# Patient Record
Sex: Male | Born: 1955 | Race: Black or African American | Hispanic: No | Marital: Married | Smoking: Current every day smoker
Health system: Southern US, Community
[De-identification: ages and names within clinical notes are randomized; demographics above are authoritative.]

---

## 2016-06-06 ENCOUNTER — Emergency Department (HOSPITAL_COMMUNITY)
Admission: EM | Admit: 2016-06-06 | Discharge: 2016-06-06 | Disposition: A | Discharge status: Home / Self Care | Payer: Self-pay | Attending: Emergency Medicine | Admitting: Emergency Medicine

## 2016-06-06 ENCOUNTER — Emergency Department (HOSPITAL_COMMUNITY): Payer: Self-pay

## 2016-06-06 ENCOUNTER — Encounter (HOSPITAL_COMMUNITY): Payer: Self-pay | Admitting: Emergency Medicine

## 2016-06-06 DIAGNOSIS — F17210 Nicotine dependence, cigarettes, uncomplicated: Secondary | ICD-10-CM | POA: Insufficient documentation

## 2016-06-06 DIAGNOSIS — R112 Nausea with vomiting, unspecified: Secondary | ICD-10-CM

## 2016-06-06 DIAGNOSIS — K2900 Acute gastritis without bleeding: Secondary | ICD-10-CM

## 2016-06-06 DIAGNOSIS — K2960 Other gastritis without bleeding: Secondary | ICD-10-CM | POA: Insufficient documentation

## 2016-06-06 LAB — COMPREHENSIVE METABOLIC PANEL
ALK PHOS: 46 U/L (ref 38–126)
ALT: 16 U/L — AB (ref 17–63)
AST: 24 U/L (ref 15–41)
Albumin: 4.4 g/dL (ref 3.5–5.0)
Anion gap: 10 (ref 5–15)
BUN: 13 mg/dL (ref 6–20)
CALCIUM: 9.6 mg/dL (ref 8.9–10.3)
CO2: 25 mmol/L (ref 22–32)
CREATININE: 0.88 mg/dL (ref 0.61–1.24)
Chloride: 102 mmol/L (ref 101–111)
GFR calc non Af Amer: >60 mL/min (ref >60)
Glucose, Bld: 139 mg/dL — ABNORMAL HIGH (ref 65–99)
Potassium: 4.4 mmol/L (ref 3.5–5.1)
SODIUM: 137 mmol/L (ref 135–145)
Total Bilirubin: 0.7 mg/dL (ref 0.3–1.2)
Total Protein: 8 g/dL (ref 6.5–8.1)

## 2016-06-06 LAB — CBC WITH DIFFERENTIAL/PLATELET
Basophils Absolute: 0 10*3/uL (ref 0.0–0.1)
Basophils Relative: 0 %
EOS ABS: 0 10*3/uL (ref 0.0–0.7)
Eosinophils Relative: 0 %
HCT: 45.6 % (ref 39.0–52.0)
HEMOGLOBIN: 15.6 g/dL (ref 13.0–17.0)
LYMPHS ABS: 1.3 10*3/uL (ref 0.7–4.0)
LYMPHS PCT: 16 %
MCH: 31.3 pg (ref 26.0–34.0)
MCHC: 34.2 g/dL (ref 30.0–36.0)
MCV: 91.4 fL (ref 78.0–100.0)
Monocytes Absolute: 0.6 10*3/uL (ref 0.1–1.0)
Monocytes Relative: 7 %
NEUTROS ABS: 6.5 10*3/uL (ref 1.7–7.7)
NEUTROS PCT: 77 %
Platelets: 221 10*3/uL (ref 150–400)
RBC: 4.99 MIL/uL (ref 4.22–5.81)
RDW: 14.1 % (ref 11.5–15.5)
WBC: 8.6 10*3/uL (ref 4.0–10.5)

## 2016-06-06 LAB — LIPASE, BLOOD: Lipase: 18 U/L (ref 11–51)

## 2016-06-06 MED ORDER — GI COCKTAIL ~~LOC~~
30.0000 mL | Freq: Once | ORAL | Status: AC
  Administered 2016-06-06: 30 mL via ORAL
  Filled 2016-06-06: qty 30

## 2016-06-06 MED ORDER — MORPHINE SULFATE (PF) 2 MG/ML IV SOLN
4.0000 mg | Freq: Once | INTRAVENOUS | Status: AC
  Administered 2016-06-06: 4 mg via INTRAVENOUS
  Filled 2016-06-06: qty 2

## 2016-06-06 MED ORDER — SODIUM CHLORIDE 0.9 % IV BOLUS (SEPSIS)
1000.0000 mL | Freq: Once | INTRAVENOUS | Status: AC
  Administered 2016-06-06: 1000 mL via INTRAVENOUS

## 2016-06-06 MED ORDER — FAMOTIDINE IN NACL 20-0.9 MG/50ML-% IV SOLN
20.0000 mg | Freq: Once | INTRAVENOUS | Status: AC
  Administered 2016-06-06: 20 mg via INTRAVENOUS
  Filled 2016-06-06: qty 50

## 2016-06-06 MED ORDER — ONDANSETRON HCL 4 MG PO TABS
4.0000 mg | ORAL_TABLET | Freq: Three times a day (TID) | ORAL | 0 refills | Status: DC | PRN
Start: 2016-06-06 — End: 2017-09-16

## 2016-06-06 MED ORDER — ONDANSETRON HCL 4 MG/2ML IJ SOLN
4.0000 mg | Freq: Once | INTRAMUSCULAR | Status: AC
  Administered 2016-06-06: 4 mg via INTRAVENOUS
  Filled 2016-06-06: qty 2

## 2016-06-06 NOTE — ED Provider Notes (Signed)
[facility]-EM[facility]Y DEPT Provider Note   CSN: [id number] Arrival date & time: 06/06/16  1428     History   Chief Complaint Chief Complaint  Patient presents with  . Emesis    HPI Glenn Levine is a 60 y.o. male.  HPI 60 year old male who presents with abdominal pain, nausea and vomiting. He is otherwise healthy and denies any past abdominal surgeries. States being in his usual state of health and around 10 AM this morning after drinking a cup of coffee developed left upper quadrant abdominal pain. States shortly afterwards he has had multiple episodes of intractable nonbloody nonbilious emesis. Denies any diarrhea or constipation. Pain localized to the left upper quadrant, stabbing in nature, constant, and unremitting. Has not tried any medications prior to arrival. Recently has also had associating productive cough, congestion, and runny nose. No fevers but endorses chills. No dysuria or urinary frequency. No known sick contacts and no recent travel. No chest pain or difficulty breathing. History reviewed. No pertinent past medical history.  There are no active problems to display for this patient.   History reviewed. No pertinent surgical history.     Home Medications    Prior to Admission medications   Medication Sig Start Date End Date Taking? Authorizing Provider  ondansetron (ZOFRAN) 4 MG tablet Take 1 tablet (4 mg total) by mouth every 8 (eight) hours as needed for nausea or vomiting. 06/06/16   Lavera Guiseana Duo Janett Kamath, MD    Family History No family history on file. Reviewed, noncontributory Social History Social History  Substance Use Topics  . Smoking status: Current Every Day Smoker    Packs/day: 1.00    Types: Cigarettes  . Smokeless tobacco: Never Used  . Alcohol use No     Allergies   Review of patient's allergies indicates not on file.   Review of Systems Review of Systems 10/14 systems reviewed and are negative other than those stated in the  HPI   Physical Exam Updated Vital Signs BP 135/83 (BP Location: Left Arm)   Pulse 68   Temp 97.6 F (36.4 C) (Oral)   Resp 18   Ht 5\' 9"  (1.753 m)   Wt 170 lb (77.1 kg)   SpO2 99%   BMI 25.10 kg/m   Physical Exam Physical Exam  Nursing note and vitals reviewed. Constitutional: appears uncomfortable with active chills, non-toxic, and in no acute distress Head: Normocephalic and atraumatic.  Mouth/Throat: Oropharynx is clear and dry.  Neck: Normal range of motion. Neck supple.  Cardiovascular: Normal rate and regular rhythm.   Pulmonary/Chest: Effort normal and breath sounds normal.  Abdominal: Soft. There is LUQ tenderness. There is no rebound and no guarding.  Musculoskeletal: Normal range of motion.  Neurological: Alert, no facial droop, fluent speech, moves all extremities symmetrically Skin: Skin is warm and dry.  Psychiatric: Cooperative   ED Treatments / Results  Labs (all labs ordered are listed, but only abnormal results are displayed) Labs Reviewed  COMPREHENSIVE METABOLIC PANEL - Abnormal; Notable for the following:       Result Value   Glucose, Bld 139 (*)    ALT 16 (*)    All other components within normal limits  CBC WITH DIFFERENTIAL/PLATELET  LIPASE, BLOOD    EKG  EKG Interpretation None       Radiology Dg Chest 2 View  Result Date: 06/06/2016 CLINICAL DATA:  Productive cough and chills EXAM: CHEST  2 VIEW COMPARISON:  None. FINDINGS: Normal heart size. Normal mediastinal contour. No pneumothorax. No  pleural effusion. Hyperinflated lungs. No pulmonary edema. No acute consolidative airspace disease. IMPRESSION: 1. Hyperinflated lungs, suggesting obstructive lung disease. 2. No acute consolidative airspace disease to suggest a pneumonia. Electronically Signed   By: Delbert PhenixJason A Poff M.D.   On: 06/06/2016 15:32    Procedures Procedures (including critical care time)  Medications Ordered in ED Medications  sodium chloride 0.9 % bolus 1,000 mL (0 mLs  Intravenous Stopped 06/06/16 1627)  ondansetron (ZOFRAN) injection 4 mg (4 mg Intravenous Given 06/06/16 1515)  morphine 2 MG/ML injection 4 mg (4 mg Intravenous Given 06/06/16 1515)  famotidine (PEPCID) IVPB 20 mg premix (0 mg Intravenous Stopped 06/06/16 1701)  gi cocktail (Maalox,Lidocaine,Donnatal) (30 mLs Oral Given 06/06/16 1703)     Initial Impression / Assessment and Plan / ED Course  I have reviewed the triage vital signs and the nursing notes.  Pertinent labs & imaging results that were available during my care of the patient were reviewed by me and considered in my medical decision making (see chart for details).  Clinical Course    60 year old male, otherwise healthy, who presents with left upper quadrant abdominal pain, nausea and vomiting. He is nontoxic in no acute distress with normal vital signs. Please mildly dry on exam. Overall soft and nonsurgical abdomen with left upper quadrant tenderness to palpation. Clinically, no signs or risk factors for obstructions. Remainder of exam is concerning. Basic blood work reveals a reassuring CBC, comp metabolic panel, lipase. Chest x-ray shows no acute cardiopulmonary processes or free air. Given IVF and antiemetics. With improved symptoms. Able to tolerate PO. Will continue supportive care management. Strict return and follow-up instructions reviewed. He expressed understanding of all discharge instructions and felt comfortable with the plan of care.   Final Clinical Impressions(s) / ED Diagnoses   Final diagnoses:  Non-intractable vomiting with nausea, unspecified vomiting type  Other acute gastritis without hemorrhage    New Prescriptions New Prescriptions   ONDANSETRON (ZOFRAN) 4 MG TABLET    Take 1 tablet (4 mg total) by mouth every 8 (eight) hours as needed for nausea or vomiting.     Lavera Guiseana Duo Jearld Hemp, MD 06/06/16 [phone]

## 2016-06-06 NOTE — Discharge Instructions (Signed)
Take medications as prescribed. Avoid anti-inflammatory medications, alcohol, fatty foods. Return for worsening symptoms, including fever, worsening pain, intractable vomiting or any other symptoms concerning to you.

## 2016-06-06 NOTE — ED Notes (Signed)
Patient transported to X-ray 

## 2016-06-06 NOTE — ED Triage Notes (Signed)
Pt woke up fine, later had abdominal pain and started vomiting, states vomited 10 times plus, green at times

## 2017-07-28 ENCOUNTER — Encounter (HOSPITAL_COMMUNITY): Payer: Self-pay | Admitting: Emergency Medicine

## 2017-07-28 ENCOUNTER — Emergency Department (HOSPITAL_COMMUNITY)
Admission: EM | Admit: 2017-07-28 | Discharge: 2017-07-28 | Disposition: A | Discharge status: Home / Self Care | Payer: Self-pay | Attending: Emergency Medicine | Admitting: Emergency Medicine

## 2017-07-28 ENCOUNTER — Emergency Department (HOSPITAL_COMMUNITY): Payer: Self-pay

## 2017-07-28 DIAGNOSIS — R109 Unspecified abdominal pain: Secondary | ICD-10-CM

## 2017-07-28 DIAGNOSIS — F17210 Nicotine dependence, cigarettes, uncomplicated: Secondary | ICD-10-CM | POA: Insufficient documentation

## 2017-07-28 DIAGNOSIS — R1012 Left upper quadrant pain: Secondary | ICD-10-CM | POA: Insufficient documentation

## 2017-07-28 DIAGNOSIS — R112 Nausea with vomiting, unspecified: Secondary | ICD-10-CM | POA: Insufficient documentation

## 2017-07-28 DIAGNOSIS — R1032 Left lower quadrant pain: Secondary | ICD-10-CM | POA: Insufficient documentation

## 2017-07-28 LAB — URINALYSIS, ROUTINE W REFLEX MICROSCOPIC
BILIRUBIN URINE: NEGATIVE
Bacteria, UA: NONE SEEN
Glucose, UA: 50 mg/dL — AB
Hgb urine dipstick: NEGATIVE
KETONES UR: 5 mg/dL — AB
LEUKOCYTES UA: NEGATIVE
Nitrite: NEGATIVE
PH: 7 (ref 5.0–8.0)
PROTEIN: 30 mg/dL — AB
Specific Gravity, Urine: 1.024 (ref 1.005–1.030)

## 2017-07-28 LAB — COMPREHENSIVE METABOLIC PANEL
ALT: 30 U/L (ref 17–63)
ANION GAP: 14 (ref 5–15)
AST: 38 U/L (ref 15–41)
Albumin: 4.4 g/dL (ref 3.5–5.0)
Alkaline Phosphatase: 58 U/L (ref 38–126)
BUN: 15 mg/dL (ref 6–20)
CHLORIDE: 105 mmol/L (ref 101–111)
CO2: 22 mmol/L (ref 22–32)
Calcium: 9.6 mg/dL (ref 8.9–10.3)
Creatinine, Ser: 0.89 mg/dL (ref 0.61–1.24)
GFR calc non Af Amer: >60 mL/min (ref >60)
Glucose, Bld: 159 mg/dL — ABNORMAL HIGH (ref 65–99)
POTASSIUM: 3.8 mmol/L (ref 3.5–5.1)
SODIUM: 141 mmol/L (ref 135–145)
Total Bilirubin: 0.5 mg/dL (ref 0.3–1.2)
Total Protein: 8 g/dL (ref 6.5–8.1)

## 2017-07-28 LAB — LIPASE, BLOOD: LIPASE: 25 U/L (ref 11–51)

## 2017-07-28 LAB — CBC
HCT: 45.1 % (ref 39.0–52.0)
HEMOGLOBIN: 14.9 g/dL (ref 13.0–17.0)
MCH: 30.1 pg (ref 26.0–34.0)
MCHC: 33 g/dL (ref 30.0–36.0)
MCV: 91.1 fL (ref 78.0–100.0)
Platelets: 217 10*3/uL (ref 150–400)
RBC: 4.95 MIL/uL (ref 4.22–5.81)
RDW: 13.7 % (ref 11.5–15.5)
WBC: 8.4 10*3/uL (ref 4.0–10.5)

## 2017-07-28 MED ORDER — DICYCLOMINE HCL 20 MG PO TABS: 20.0000 mg | ORAL_TABLET | Freq: Four times a day (QID) | ORAL | 0 refills | Status: DC | PRN

## 2017-07-28 MED ORDER — IOPAMIDOL (ISOVUE-300) INJECTION 61%
100.0000 mL | Freq: Once | INTRAVENOUS | Status: AC | PRN
  Administered 2017-07-28: 100 mL via INTRAVENOUS

## 2017-07-28 MED ORDER — FAMOTIDINE IN NACL 20-0.9 MG/50ML-% IV SOLN
20.0000 mg | Freq: Once | INTRAVENOUS | Status: AC
  Administered 2017-07-28: 20 mg via INTRAVENOUS
  Filled 2017-07-28: qty 50

## 2017-07-28 MED ORDER — SODIUM CHLORIDE 0.9 % IV BOLUS (SEPSIS)
500.0000 mL | Freq: Once | INTRAVENOUS | Status: AC
  Administered 2017-07-28: 500 mL via INTRAVENOUS

## 2017-07-28 MED ORDER — SODIUM CHLORIDE 0.9 % IV SOLN
INTRAVENOUS | Status: DC
  Administered 2017-07-28: 11:00:00 via INTRAVENOUS

## 2017-07-28 MED ORDER — ONDANSETRON HCL 4 MG/2ML IJ SOLN
4.0000 mg | Freq: Once | INTRAMUSCULAR | Status: AC | PRN
  Administered 2017-07-28: 4 mg via INTRAVENOUS
  Filled 2017-07-28: qty 2

## 2017-07-28 MED ORDER — DICYCLOMINE HCL 10 MG/ML IM SOLN
20.0000 mg | Freq: Once | INTRAMUSCULAR | Status: AC
  Administered 2017-07-28: 20 mg via INTRAMUSCULAR
  Filled 2017-07-28: qty 2

## 2017-07-28 MED ORDER — MORPHINE SULFATE (PF) 4 MG/ML IV SOLN
4.0000 mg | INTRAVENOUS | Status: AC | PRN
  Administered 2017-07-28 (×2): 4 mg via INTRAVENOUS
  Filled 2017-07-28 (×2): qty 1

## 2017-07-28 MED ORDER — ONDANSETRON HCL 4 MG/2ML IJ SOLN
4.0000 mg | INTRAMUSCULAR | Status: DC | PRN
Start: 2017-07-28 — End: 2017-07-28
  Administered 2017-07-28: 4 mg via INTRAVENOUS
  Filled 2017-07-28: qty 2

## 2017-07-28 MED ORDER — ONDANSETRON 4 MG PO TBDP: 4.0000 mg | ORAL_TABLET | Freq: Three times a day (TID) | ORAL | 0 refills | Status: DC | PRN

## 2017-07-28 NOTE — ED Triage Notes (Signed)
Pt reports sudden onset of generalized abdominal pain with n/v that began this morning. Denies diarrhea. States he has vomited > 30 times.

## 2017-07-28 NOTE — ED Notes (Signed)
EDP at bedside updating patient and family. 

## 2017-07-28 NOTE — ED Notes (Signed)
Pt tolerating PO fluids at this time.

## 2017-07-28 NOTE — ED Notes (Signed)
Pt given ginger ale.

## 2017-07-28 NOTE — ED Provider Notes (Signed)
[facility] Provider [facility]   CSN: [id number] Arrival date & time: 07/28/17  1004     History   Chief Complaint Chief Complaint  Patient presents with  . Abdominal Pain    HPI Glenn Levine is a 61 y.o. male.  HPI  Pt was seen at 1055.  Per pt, c/o gradual onset and persistence of constant left sided abd "pain" since approximately 0400/0500 PTA. Has been associated with multiple intermittent episodes of N/V.  Describes the abd pain as "cramping."  Denies diarrhea, no fevers, no back pain, no rash, no CP/SOB, no black or blood in stools or emesis.       History reviewed. No pertinent past medical history.  There are no active problems to display for this patient.   History reviewed. No pertinent surgical history.     Home Medications    Prior to Admission medications   Medication Sig Start Date End Date Taking? Authorizing Provider  ondansetron (ZOFRAN) 4 MG tablet Take 1 tablet (4 mg total) by mouth every 8 (eight) hours as needed for nausea or vomiting. 06/06/16   Lavera GuiseLiu, Dana Duo, MD    Family History No family history on file.  Social History Social History   Tobacco Use  . Smoking status: Current Every Day Smoker    Packs/day: 1.00    Types: Cigarettes  . Smokeless tobacco: Never Used  Substance Use Topics  . Alcohol use: Yes    Comment: socially  . Drug use: No     Allergies   Erythromycin   Review of Systems Review of Systems ROS: Statement: All systems negative except as marked or noted in the HPI; Constitutional: Negative for fever and chills. ; ; Eyes: Negative for eye pain, redness and discharge. ; ; ENMT: Negative for ear pain, hoarseness, nasal congestion, sinus pressure and sore throat. ; ; Cardiovascular: Negative for chest pain, palpitations, diaphoresis, dyspnea and peripheral edema. ; ; Respiratory: Negative for cough, wheezing and stridor. ; ; Gastrointestinal: +N/V, abd pain. Negative for diarrhea, blood in stool,  hematemesis, jaundice and rectal bleeding. . ; ; Genitourinary: Negative for dysuria, flank pain and hematuria. ; ; Genital:  No penile drainage or rash, no testicular pain or swelling, no scrotal rash or swelling. ;; Musculoskeletal: Negative for back pain and neck pain. Negative for swelling and trauma.; ; Skin: Negative for pruritus, rash, abrasions, blisters, bruising and skin lesion.; ; Neuro: Negative for headache, lightheadedness and neck stiffness. Negative for weakness, altered level of consciousness, altered mental status, extremity weakness, paresthesias, involuntary movement, seizure and syncope.       Physical Exam Updated Vital Signs BP (!) 168/88 (BP Location: Right Arm)   Pulse 67   Temp 98.2 F (36.8 C) (Oral)   Resp 18   Ht 5\' 9"  (1.753 m)   Wt 77.1 kg (170 lb)   SpO2 100%   BMI 25.10 kg/m   Physical Exam 1100: Physical examination:  Nursing notes reviewed; Vital signs and O2 SAT reviewed;  Constitutional: Well developed, Well nourished, Well hydrated, Uncomfortable appearing.; Head:  Normocephalic, atraumatic; Eyes: EOMI, PERRL, No scleral icterus; ENMT: Mouth and pharynx normal, Mucous membranes moist; Neck: Supple, Full range of motion, No lymphadenopathy; Cardiovascular: Regular rate and rhythm, No gallop; Respiratory: Breath sounds clear & equal bilaterally, No wheezes.  Speaking full sentences with ease, Normal respiratory effort/excursion; Chest: Nontender, Movement normal; Abdomen: Soft, +LUQ and LLQ tenderness to palp. No rebound or guarding. Nondistended, Normal bowel sounds; Genitourinary: No CVA tenderness;  Extremities: Pulses normal, No tenderness, No edema, No calf edema or asymmetry.; Neuro: AA&Ox3, Major CN grossly intact.  Speech clear. No gross focal motor or sensory deficits in extremities.; Skin: Color normal, Warm, Dry.   ED Treatments / Results  Labs (all labs ordered are listed, but only abnormal results are displayed)   EKG  EKG  Interpretation None       Radiology   Procedures Procedures (including critical care time)  Medications Ordered in ED Medications  morphine 4 MG/ML injection 4 mg (not administered)  ondansetron (ZOFRAN) injection 4 mg (not administered)  0.9 %  sodium chloride infusion (not administered)  sodium chloride 0.9 % bolus 500 mL (not administered)  ondansetron (ZOFRAN) injection 4 mg (4 mg Intravenous Given 07/28/17 1037)     Initial Impression / Assessment and Plan / ED Course  I have reviewed the triage vital signs and the nursing notes.  Pertinent labs & imaging results that were available during my care of the patient were reviewed by me and considered in my medical decision making (see chart for details).  MDM Reviewed: previous chart, nursing note and vitals Reviewed previous: labs Interpretation: labs, x-ray and CT scan    Results for orders placed or performed during the hospital encounter of 07/28/17  Lipase, blood  Result Value Ref Range   Lipase 25 11 - 51 U/L  Comprehensive metabolic panel  Result Value Ref Range   Sodium 141 135 - 145 mmol/L   Potassium 3.8 3.5 - 5.1 mmol/L   Chloride 105 101 - 111 mmol/L   CO2 22 22 - 32 mmol/L   Glucose, Bld 159 (H) 65 - 99 mg/dL   BUN 15 6 - 20 mg/dL   Creatinine, Ser 1.61 0.61 - 1.24 mg/dL   Calcium 9.6 8.9 - 09.6 mg/dL   Total Protein 8.0 6.5 - 8.1 g/dL   Albumin 4.4 3.5 - 5.0 g/dL   AST 38 15 - 41 U/L   ALT 30 17 - 63 U/L   Alkaline Phosphatase 58 38 - 126 U/L   Total Bilirubin 0.5 0.3 - 1.2 mg/dL   GFR calc non Af Amer >60 >60 mL/min   GFR calc Af Amer >60 >60 mL/min   Anion gap 14 5 - 15  CBC  Result Value Ref Range   WBC 8.4 4.0 - 10.5 K/uL   RBC 4.95 4.22 - 5.81 MIL/uL   Hemoglobin 14.9 13.0 - 17.0 g/dL   HCT 04.5 40.9 - 81.1 %   MCV 91.1 78.0 - 100.0 fL   MCH 30.1 26.0 - 34.0 pg   MCHC 33.0 30.0 - 36.0 g/dL   RDW 91.4 78.2 - 95.6 %   Platelets 217 150 - 400 K/uL  Urinalysis, Routine w reflex  microscopic  Result Value Ref Range   Color, Urine YELLOW YELLOW   APPearance CLEAR CLEAR   Specific Gravity, Urine 1.024 1.005 - 1.030   pH 7.0 5.0 - 8.0   Glucose, UA 50 (A) NEGATIVE mg/dL   Hgb urine dipstick NEGATIVE NEGATIVE   Bilirubin Urine NEGATIVE NEGATIVE   Ketones, ur 5 (A) NEGATIVE mg/dL   Protein, ur 30 (A) NEGATIVE mg/dL   Nitrite NEGATIVE NEGATIVE   Leukocytes, UA NEGATIVE NEGATIVE   RBC / HPF 0-5 0 - 5 RBC/hpf   WBC, UA 0-5 0 - 5 WBC/hpf   Bacteria, UA NONE SEEN NONE SEEN   Squamous Epithelial / LPF 0-5 (A) NONE SEEN   Mucus PRESENT    Dg  Chest 2 View  Result Date: 07/28/2017 CLINICAL DATA:  Abdominal pain, nausea, vomiting EXAM: CHEST  2 VIEW COMPARISON:  06/06/2016 FINDINGS: Heart and mediastinal contours are within normal limits. No focal opacities or effusions. No acute bony abnormality. Mild hyperinflation. IMPRESSION: Stable mild hyperinflation.  No active cardiopulmonary disease. Electronically Signed   By: Charlett NoseKevin  Dover M.D.   On: 07/28/2017 11:53   Ct Abdomen Pelvis W Contrast Result Date: 07/28/2017 CLINICAL DATA:  61 year old with generalized abdominal pain with nausea and vomiting. EXAM: CT ABDOMEN AND PELVIS WITH CONTRAST TECHNIQUE: Multidetector CT imaging of the abdomen and pelvis was performed using the standard protocol following bolus administration of intravenous contrast. CONTRAST:  100 mL Isovue-300 COMPARISON:  CT 06/02/2011 FINDINGS: Lower chest: There may be mild emphysematous changes in the lungs. Lung bases are clear without pleural effusions. Hepatobiliary: Normal appearance of the liver, gallbladder and portal venous system. Pancreas: Normal appearance of the pancreas without inflammation or duct dilatation. Spleen: Normal appearance of spleen without enlargement. Adrenals/Urinary Tract: Normal adrenal glands. Normal kidneys without hydronephrosis. No suspicious renal lesions. Urinary bladder is unremarkable. Stomach/Bowel: Extensive diverticulosis  involving the sigmoid colon but no acute bowel inflammation. Normal caliber of the appendix but the mid and distal aspect to the appendix contains very low density material. Hounsfield units in the appendix measure roughly -50 and suggestive for fat material. There is no evidence for acute appendix inflammation. Small hiatal hernia. There is no significant stomach distention. Normal appearance of the duodenum. Mild stranding at the root of the mesentery with few prominent lymph nodes and these findings are similar to the prior examination. Vascular/Lymphatic: Main visceral arteries are patent. Atherosclerotic disease in the abdominal aorta. Infrarenal abdominal aorta is mildly enlarged measuring up to 2.9 cm. Atherosclerotic calcifications in the bilateral iliac arteries. Proximal femoral arteries are patent bilaterally. Reproductive: Few prominent central mesenteric lymph nodes. Overall, no significant lymph node enlargement in the abdomen or pelvis. Other: No free fluid.  No free air. Musculoskeletal: No acute bone abnormality. Mild disc space narrowing at L4-L5 and L5-S1. IMPRESSION: No acute abnormality in the abdomen or pelvis. Mid and distal appendix contains very low-density material. Findings raise the possibility of a lipoma or lipomatous changes in the appendix. No acute inflammatory changes in the appendix. Small hiatal hernia. Diverticulosis without acute inflammation. Abdominal aorta measures 2.9 cm. Ectatic abdominal aorta at risk for aneurysm development. Recommend followup by ultrasound in 5 years. This recommendation follows ACR consensus guidelines: White Paper of the ACR Incidental Findings Committee II on Vascular Findings. J Am Coll Radiol 2013; 10:789-794. Electronically Signed   By: Richarda OverlieAdam  Henn M.D.   On: 07/28/2017 12:36    1425:  Pt has tol PO well while in the ED without N/V.  No stooling while in the ED.  Abd benign, VSS. Feels better and wants to go home now. Tx symptomatically at this  time. Dx and testing d/w pt and family.  Questions answered.  Verb understanding, agreeable to d/c home with outpt f/u.      Final Clinical Impressions(s) / ED Diagnoses   Final diagnoses:  None    ED Discharge Orders    None       Samuel JesterMcManus, Hyland Mollenkopf, DO 07/29/17 1924

## 2017-07-28 NOTE — ED Notes (Signed)
Pt notified a urine sample is needed. Verbalized understanding and will let staff know when one can be obtained. Urinal at bedside.

## 2017-07-28 NOTE — Discharge Instructions (Signed)
Take the prescriptions as directed.  Increase your fluid intake (ie:  Gatoraide) for the next few days, as discussed.  Eat a bland diet and advance to your regular diet slowly as you can tolerate it. Your CT scan showed an incidental finding(s): "Abdominal aorta measures 2.9 cm. Ectatic abdominal aorta at risk for aneurysm development. Recommend followup by ultrasound in 5 years. This recommendation follows ACR consensus guidelines: White Paper of the ACR Incidental Findings Committee II on Vascular Findings. J Am Coll Radiol 2013; 10:789-794."  Your regular medical doctor can follow up this finding. Call your regular medical doctor tomorrow to schedule a follow up appointment this week.  Return to the Emergency Department immediately if not improving (or even worsening) despite taking the medicines as prescribed, any black or bloody stool or vomit, if you develop a fever over "101," or for any other concerns.

## 2017-09-16 ENCOUNTER — Encounter (HOSPITAL_COMMUNITY): Payer: Self-pay

## 2017-09-16 ENCOUNTER — Emergency Department (HOSPITAL_COMMUNITY)
Admission: EM | Admit: 2017-09-16 | Discharge: 2017-09-16 | Disposition: A | Discharge status: Home / Self Care | Payer: Self-pay | Attending: Emergency Medicine | Admitting: Emergency Medicine

## 2017-09-16 ENCOUNTER — Emergency Department (HOSPITAL_COMMUNITY): Payer: Self-pay

## 2017-09-16 ENCOUNTER — Other Ambulatory Visit: Payer: Self-pay

## 2017-09-16 DIAGNOSIS — J111 Influenza due to unidentified influenza virus with other respiratory manifestations: Secondary | ICD-10-CM | POA: Insufficient documentation

## 2017-09-16 DIAGNOSIS — R69 Illness, unspecified: Secondary | ICD-10-CM

## 2017-09-16 DIAGNOSIS — F17210 Nicotine dependence, cigarettes, uncomplicated: Secondary | ICD-10-CM | POA: Insufficient documentation

## 2017-09-16 MED ORDER — PREDNISONE 50 MG PO TABS
60.0000 mg | ORAL_TABLET | Freq: Once | ORAL | Status: AC
  Administered 2017-09-16: 60 mg via ORAL
  Filled 2017-09-16: qty 1

## 2017-09-16 MED ORDER — PREDNISONE 10 MG PO TABS: 40.0000 mg | ORAL_TABLET | Freq: Every day | ORAL | 0 refills | Status: AC

## 2017-09-16 MED ORDER — IPRATROPIUM-ALBUTEROL 0.5-2.5 (3) MG/3ML IN SOLN
3.0000 mL | Freq: Once | RESPIRATORY_TRACT | Status: AC
  Administered 2017-09-16: 3 mL via RESPIRATORY_TRACT
  Filled 2017-09-16: qty 3

## 2017-09-16 NOTE — ED Notes (Signed)
Pt taken to xray 

## 2017-09-16 NOTE — Discharge Instructions (Signed)
Start taking prednisone as prescribed beginning tomorrow.  You received the first dose in the emergency department today.  Drink plenty of fluids and get plenty of rest.  Take [phone] mg of Tylenol every 6 hours as needed for fever and pain.  Do not exceed 4000 mg of Tylenol daily.  You may continue using over-the-counter medicines for your nasal congestion and cough.  Follow-up with a primary care physician for reevaluation of your symptoms.  Return to the emergency department if any concerning signs or symptoms develop.

## 2017-09-16 NOTE — ED Triage Notes (Signed)
Reports of cough, fever and body aches x4-5 days.

## 2017-09-16 NOTE — ED Notes (Signed)
RT aware of breathing tx.

## 2017-09-16 NOTE — ED Provider Notes (Signed)
[facility] Provider Note   CSN: [id number] Arrival date & time: 09/16/17  1306     History   Chief Complaint Chief Complaint  Patient presents with  . Cough  . Fever    HPI Glenn Levine is a 62 y.o. male with no significant past medical history presents today with chief complaint acute onset, progressively worsening nonproductive cough, fever, and myalgias for 5 days.  He endorses mild nasal congestion, no sore throat.  Cough is nonproductive.  He denies shortness of breath or chest pain.  He denies abdominal pain, nausea, or vomiting.  He endorses decreased appetite but states he has been staying well-hydrated and endorses normal bowel movements and urine output.  He has tried Alka-Seltzer with some improvement in his symptoms.  His wife gave him 1 tablet of Tamiflu without relief of his symptoms.  He is a current smoker of approximately a pack of cigarettes daily.  The history is provided by the patient and the spouse.    History reviewed. No pertinent past medical history.  There are no active problems to display for this patient.   History reviewed. No pertinent surgical history.     Home Medications    Prior to Admission medications   Medication Sig Start Date End Date Taking? Authorizing Provider  Phenyleph-Doxylamine-DM-APAP (ALKA-SELTZER PLS ALLERGY & CGH) 5-6.25-10-325 MG CAPS Take 1-2 capsules by mouth daily as needed (for cold/fever).   Yes [provider]  dicyclomine (BENTYL) 20 MG tablet Take 1 tablet (20 mg total) by mouth every 6 (six) hours as needed for spasms (abdominal cramping). Patient not taking: Reported on 09/16/2017 07/28/17   Samuel JesterMcManus, Kathleen, DO  ondansetron (ZOFRAN ODT) 4 MG disintegrating tablet Take 1 tablet (4 mg total) by mouth every 8 (eight) hours as needed for nausea or vomiting. Patient not taking: Reported on 09/16/2017 07/28/17   Samuel JesterMcManus, Kathleen, DO  predniSONE (DELTASONE) 10 MG tablet Take 4 tablets  (40 mg total) by mouth daily for 4 days. 09/16/17 09/20/17  Jeanie SewerFawze, Takoda Siedlecki A, PA-C    Family History No family history on file.  Social History Social History   Tobacco Use  . Smoking status: Current Every Day Smoker    Packs/day: 1.00    Types: Cigarettes  . Smokeless tobacco: Never Used  Substance Use Topics  . Alcohol use: Yes    Comment: socially  . Drug use: No     Allergies   Erythromycin   Review of Systems Review of Systems  Constitutional: Positive for chills and fever.  HENT: Positive for congestion. Negative for sore throat.   Respiratory: Positive for cough and wheezing. Negative for shortness of breath.   Cardiovascular: Negative for chest pain.  Musculoskeletal: Positive for myalgias. Negative for back pain.  All other systems reviewed and are negative.    Physical Exam Updated Vital Signs BP 121/79   Pulse 73   Temp 100 F (37.8 C) (Oral)   Resp 17   Ht 5\' 9"  (1.753 m)   Wt 74.8 kg (165 lb)   SpO2 97%   BMI 24.37 kg/m   Physical Exam  Constitutional: He appears well-developed and well-nourished. No distress.  HENT:  Head: Normocephalic and atraumatic.  Right Ear: External ear normal.  Left Ear: External ear normal.  TMs without erythema or bulging bilaterally.  Nasal septum midline, mild mucosal edema bilaterally.  Posterior oropharynx without erythema, tonsillar hypertrophy, exudates, or uvular deviation.  No trismus.  Eyes: Conjunctivae and EOM are normal. Pupils are  equal, round, and reactive to light. Right eye exhibits no discharge. Left eye exhibits no discharge.  Neck: Normal range of motion. Neck supple. No JVD present. No tracheal deviation present.  Cardiovascular: Normal rate, regular rhythm and normal heart sounds.  Pulmonary/Chest: Effort normal. He has wheezes. He exhibits no tenderness.  Equal rise and fall of chest, no increased work of breathing.  Speaking in full sentences without difficulty.  Diffuse expiratory wheezing in  anterior and posterior lung fields  Abdominal: Soft. Bowel sounds are normal. He exhibits no distension. There is no tenderness.  Musculoskeletal: He exhibits no edema.  Lymphadenopathy:    He has no cervical adenopathy.  Neurological: He is alert.  Skin: Skin is warm and dry. No erythema.  Psychiatric: He has a normal mood and affect. His behavior is normal.  Nursing note and vitals reviewed.    ED Treatments / Results  Labs (all labs ordered are listed, but only abnormal results are displayed) Labs Reviewed - No data to display  EKG  EKG Interpretation None       Radiology Dg Chest 2 View  Result Date: 09/16/2017 CLINICAL DATA:  Productive cough, congestion EXAM: CHEST  2 VIEW COMPARISON:  07/28/2017 FINDINGS: Heart and mediastinal contours are within normal limits. No focal opacities or effusions. No acute bony abnormality. IMPRESSION: No active cardiopulmonary disease. Electronically Signed   By: Charlett Nose M.D.   On: 09/16/2017 14:08    Procedures Procedures (including critical care time)  Medications Ordered in ED Medications  ipratropium-albuterol (DUONEB) 0.5-2.5 (3) MG/3ML nebulizer solution 3 mL (3 mLs Nebulization Given 09/16/17 1445)  predniSONE (DELTASONE) tablet 60 mg (60 mg Oral Given 09/16/17 1428)     Initial Impression / Assessment and Plan / ED Course  I have reviewed the triage vital signs and the nursing notes.  Pertinent labs & imaging results that were available during my care of the patient were reviewed by me and considered in my medical decision making (see chart for details).     Patient presents with flulike symptoms for 5 days.  Afebrile, vital signs are stable while in the ED.  He is nontoxic in appearance.  Chest x-ray shows no acute cardiopulmonary abnormalities, no evidence of pneumonia, pleural effusion, or bronchitis.  Symptoms consistent with viral process.  No evidence of strep pharyngitis or PTA.  No meningeal signs to suggest  meningitis.  Discussed with patient and his wife that he is out of the window for treatment with Tamiflu.  He did have wheezing on auscultation of the lungs, this significantly improved after prednisone and breathing treatment.  Will discharge with a short course of prednisone for his wheezing.  Discussed symptomatic treatment.  Recommend follow-up with primary care physician for reevaluation of symptoms.  Discussed indications for return to the ED. Pt and wife verbalized understanding of and agreement with plan and patient is safe for discharge home at this time.  No complaints prior to discharge.  Final Clinical Impressions(s) / ED Diagnoses   Final diagnoses:  Influenza-like illness    ED Discharge Orders        Ordered    predniSONE (DELTASONE) 10 MG tablet  Daily     09/16/17 1522       Jeanie Sewer, PA-C 09/16/17 1718    Samuel Jester, DO 09/18/17 1301

## 2017-09-19 ENCOUNTER — Encounter (HOSPITAL_COMMUNITY): Payer: Self-pay | Admitting: Emergency Medicine

## 2017-09-19 ENCOUNTER — Other Ambulatory Visit: Payer: Self-pay

## 2017-09-19 ENCOUNTER — Emergency Department (HOSPITAL_COMMUNITY)
Admission: EM | Admit: 2017-09-19 | Discharge: 2017-09-19 | Disposition: A | Discharge status: Home / Self Care | Payer: Self-pay | Attending: Emergency Medicine | Admitting: Emergency Medicine

## 2017-09-19 ENCOUNTER — Emergency Department (HOSPITAL_COMMUNITY): Payer: Self-pay

## 2017-09-19 DIAGNOSIS — F17210 Nicotine dependence, cigarettes, uncomplicated: Secondary | ICD-10-CM | POA: Insufficient documentation

## 2017-09-19 DIAGNOSIS — J069 Acute upper respiratory infection, unspecified: Secondary | ICD-10-CM | POA: Insufficient documentation

## 2017-09-19 DIAGNOSIS — Z79899 Other long term (current) drug therapy: Secondary | ICD-10-CM | POA: Insufficient documentation

## 2017-09-19 LAB — CBC WITH DIFFERENTIAL/PLATELET
BASOS PCT: 0 %
Basophils Absolute: 0 10*3/uL (ref 0.0–0.1)
EOS ABS: 0 10*3/uL (ref 0.0–0.7)
Eosinophils Relative: 0 %
HEMATOCRIT: 44.2 % (ref 39.0–52.0)
HEMOGLOBIN: 15 g/dL (ref 13.0–17.0)
Lymphocytes Relative: 23 %
Lymphs Abs: 2.2 10*3/uL (ref 0.7–4.0)
MCH: 29.9 pg (ref 26.0–34.0)
MCHC: 33.9 g/dL (ref 30.0–36.0)
MCV: 88.2 fL (ref 78.0–100.0)
MONOS PCT: 10 %
Monocytes Absolute: 1 10*3/uL (ref 0.1–1.0)
NEUTROS ABS: 6.4 10*3/uL (ref 1.7–7.7)
NEUTROS PCT: 67 %
Platelets: 181 10*3/uL (ref 150–400)
RBC: 5.01 MIL/uL (ref 4.22–5.81)
RDW: 13.6 % (ref 11.5–15.5)
WBC: 9.6 10*3/uL (ref 4.0–10.5)

## 2017-09-19 LAB — TROPONIN I: Troponin I: <0.03 ng/mL (ref <0.03)

## 2017-09-19 LAB — BRAIN NATRIURETIC PEPTIDE: B NATRIURETIC PEPTIDE 5: 279 pg/mL — AB (ref 0.0–100.0)

## 2017-09-19 MED ORDER — ALBUTEROL SULFATE (2.5 MG/3ML) 0.083% IN NEBU
5.0000 mg | INHALATION_SOLUTION | Freq: Once | RESPIRATORY_TRACT | Status: AC
  Administered 2017-09-19: 5 mg via RESPIRATORY_TRACT
  Filled 2017-09-19: qty 6

## 2017-09-19 MED ORDER — ALBUTEROL SULFATE HFA 108 (90 BASE) MCG/ACT IN AERS
2.0000 | INHALATION_SPRAY | RESPIRATORY_TRACT | Status: DC | PRN
  Administered 2017-09-19: 2 via RESPIRATORY_TRACT
  Filled 2017-09-19: qty 6.7

## 2017-09-19 NOTE — ED Triage Notes (Signed)
Pt reports he began feeling SOB yesterday with productive cough. Was seen here on Thursday for flu like sx and given Prednisone, states he has been taking it.

## 2017-09-19 NOTE — ED Notes (Signed)
From Rad 

## 2017-09-19 NOTE — Discharge Instructions (Signed)
Use inhaler as needed.  Continue the steroids you are on.  Follow-up with a primary care doctor for further evaluation.

## 2017-09-19 NOTE — ED Provider Notes (Signed)
[facility] Provider Note   CSN: [id number] Arrival date & time: 09/19/17  1559     History   Chief Complaint Chief Complaint  Patient presents with  . Shortness of Breath    HPI Glenn Levine is a 62 y.o. male.  HPI Patient with shortness of breath.  Cough.  Seen 2 days ago for flulike symptoms and is been on prednisone.  Not given inhaler.  He does smoke around half a pack of cigarettes a day.  No swelling in his legs.  No real chest pain.  States he does feel fatigued.  States he initially got a little better but now is worsened. History reviewed. No pertinent past medical history.  There are no active problems to display for this patient.   History reviewed. No pertinent surgical history.     Home Medications    Prior to Admission medications   Medication Sig Start Date End Date Taking? Authorizing Provider  Phenyleph-Doxylamine-DM-APAP (ALKA-SELTZER PLS ALLERGY & [facility]) 5-6.25-10-325 MG CAPS Take 1-2 capsules by mouth daily as needed (for cold/fever).   Yes [provider]  predniSONE (DELTASONE) 10 MG tablet Take 4 tablets (40 mg total) by mouth daily for 4 days. 09/16/17 09/20/17 Yes Fawze, Mina A, PA-C  dicyclomine (BENTYL) 20 MG tablet Take 1 tablet (20 mg total) by mouth every 6 (six) hours as needed for spasms (abdominal cramping). Patient not taking: Reported on 09/16/2017 07/28/17   Samuel JesterMcManus, Kathleen, DO  ondansetron (ZOFRAN ODT) 4 MG disintegrating tablet Take 1 tablet (4 mg total) by mouth every 8 (eight) hours as needed for nausea or vomiting. Patient not taking: Reported on 09/16/2017 07/28/17   Samuel JesterMcManus, Kathleen, DO    Family History History reviewed. No pertinent family history.  Social History Social History   Tobacco Use  . Smoking status: Current Every Day Smoker    Packs/day: 1.00    Types: Cigarettes  . Smokeless tobacco: Never Used  Substance Use Topics  . Alcohol use: Yes    Comment: socially  . Drug use:  No     Allergies   Erythromycin   Review of Systems Review of Systems  Constitutional: Negative for appetite change and fever.  HENT: Negative for congestion.   Respiratory: Positive for cough and shortness of breath.   Cardiovascular: Negative for chest pain and leg swelling.  Gastrointestinal: Negative for abdominal pain.  Genitourinary: Negative for flank pain.  Musculoskeletal: Positive for myalgias.  Neurological: Negative for weakness.  Hematological: Negative for adenopathy.  Psychiatric/Behavioral: Negative for confusion.     Physical Exam Updated Vital Signs BP 138/75   Pulse (!) 52   Temp 98.2 F (36.8 C) (Oral)   Resp 16   Ht 5\' 9"  (1.753 m)   Wt 74.8 kg (165 lb)   SpO2 96%   BMI 24.37 kg/m   Physical Exam  Constitutional: He appears well-developed.  HENT:  Head: Normocephalic.  Eyes: Pupils are equal, round, and reactive to light.  Neck: Neck supple.  Pulmonary/Chest: He has wheezes.  Diffuse wheezes and mildly prolonged expirations.  Abdominal: There is no tenderness.  Musculoskeletal: Normal range of motion.       Right lower leg: He exhibits no edema.       Left lower leg: He exhibits no edema.  Neurological: He is alert.  Skin: Skin is warm. Capillary refill takes less than 2 seconds.     ED Treatments / Results  Labs (all labs ordered are listed, but only abnormal results  are displayed) Labs Reviewed  BRAIN NATRIURETIC PEPTIDE - Abnormal; Notable for the following components:      Result Value   B Natriuretic Peptide 279.0 (*)    All other components within normal limits  CBC WITH DIFFERENTIAL/PLATELET  TROPONIN I    EKG  EKG Interpretation  Date/Time:  Saturday September 19 2017 16:07:33 EST Ventricular Rate:  61 PR Interval:  144 QRS Duration: 94 QT Interval:  406 QTC Calculation: 408 R Axis:   -40 Text Interpretation:  Normal sinus rhythm Left axis deviation Septal infarct , age undetermined ST & T wave abnormality,  consider inferior ischemia Abnormal ECG No old tracing to compare Confirmed by Benjiman Core [phone]) on 09/19/2017 4:30:23 PM       Radiology Dg Chest 2 View  Result Date: 09/19/2017 CLINICAL DATA:  Shortness of breath.  09/16/2017 EXAM: CHEST  2 VIEW COMPARISON:  None. FINDINGS: The heart size and mediastinal contours are within normal limits. Both lungs are clear. The visualized skeletal structures are unremarkable. IMPRESSION: No active cardiopulmonary disease. Electronically Signed   By: Signa Kell M.D.   On: 09/19/2017 16:52    Procedures Procedures (including critical care time)  Medications Ordered in ED Medications  albuterol (PROVENTIL HFA;VENTOLIN HFA) 108 (90 Base) MCG/ACT inhaler 2 puff (2 puffs Inhalation Given 09/19/17 1709)  albuterol (PROVENTIL) (2.5 MG/3ML) 0.083% nebulizer solution 5 mg (5 mg Nebulization Given 09/19/17 1649)     Initial Impression / Assessment and Plan / ED Course  I have reviewed the triage vital signs and the nursing notes.  Pertinent labs & imaging results that were available during my care of the patient were reviewed by me and considered in my medical decision making (see chart for details).    Patient with shortness of breath and cough.  Recently seen for same.  X-ray reassuring.  No CHF.  BNP is however elevated.  Nonspecific EKG changes.  Normal troponin.  Patient feels better after albuterol and breathing treatment.  Will discharge with albuterol inhaler.  Will need to follow-up with a primary care doctor.  Pulmonary embolism felt less likely.  Final Clinical Impressions(s) / ED Diagnoses   Final diagnoses:  Upper respiratory tract infection, unspecified type    ED Discharge Orders    None       Benjiman Core, MD 09/19/17 [phone]

## 2017-09-19 NOTE — ED Notes (Signed)
Pt smokes 1 PPD   Here Weds, given prednisone and continues to have dyspnea  Coughing up clear frothy expectorant

## 2018-02-07 ENCOUNTER — Emergency Department (HOSPITAL_COMMUNITY): Payer: Self-pay

## 2018-02-07 ENCOUNTER — Emergency Department (HOSPITAL_COMMUNITY)
Admission: EM | Admit: 2018-02-07 | Discharge: 2018-02-07 | Disposition: A | Discharge status: Home / Self Care | Payer: Self-pay | Attending: Emergency Medicine | Admitting: Emergency Medicine

## 2018-02-07 ENCOUNTER — Other Ambulatory Visit: Payer: Self-pay

## 2018-02-07 ENCOUNTER — Encounter (HOSPITAL_COMMUNITY): Payer: Self-pay | Admitting: Emergency Medicine

## 2018-02-07 DIAGNOSIS — F17210 Nicotine dependence, cigarettes, uncomplicated: Secondary | ICD-10-CM | POA: Insufficient documentation

## 2018-02-07 DIAGNOSIS — R109 Unspecified abdominal pain: Secondary | ICD-10-CM | POA: Insufficient documentation

## 2018-02-07 DIAGNOSIS — M7918 Myalgia, other site: Secondary | ICD-10-CM

## 2018-02-07 DIAGNOSIS — M791 Myalgia, unspecified site: Secondary | ICD-10-CM | POA: Insufficient documentation

## 2018-02-07 LAB — COMPREHENSIVE METABOLIC PANEL
ALBUMIN: 3.8 g/dL (ref 3.5–5.0)
ALK PHOS: 42 U/L (ref 38–126)
ALT: 16 U/L (ref 0–44)
ANION GAP: 7 (ref 5–15)
AST: 22 U/L (ref 15–41)
BILIRUBIN TOTAL: 0.9 mg/dL (ref 0.3–1.2)
BUN: 13 mg/dL (ref 8–23)
CALCIUM: 9.1 mg/dL (ref 8.9–10.3)
CO2: 26 mmol/L (ref 22–32)
Chloride: 104 mmol/L (ref 98–111)
Creatinine, Ser: 0.7 mg/dL (ref 0.61–1.24)
GFR calc Af Amer: >60 mL/min (ref >60)
GFR calc non Af Amer: >60 mL/min (ref >60)
GLUCOSE: 121 mg/dL — AB (ref 70–99)
POTASSIUM: 3.8 mmol/L (ref 3.5–5.1)
SODIUM: 137 mmol/L (ref 135–145)
Total Protein: 7.2 g/dL (ref 6.5–8.1)

## 2018-02-07 LAB — CBC WITH DIFFERENTIAL/PLATELET
Basophils Absolute: 0 10*3/uL (ref 0.0–0.1)
Basophils Relative: 1 %
EOS PCT: 2 %
Eosinophils Absolute: 0.1 10*3/uL (ref 0.0–0.7)
HEMATOCRIT: 45.5 % (ref 39.0–52.0)
Hemoglobin: 15.2 g/dL (ref 13.0–17.0)
LYMPHS PCT: 25 %
Lymphs Abs: 1.6 10*3/uL (ref 0.7–4.0)
MCH: 31.1 pg (ref 26.0–34.0)
MCHC: 33.4 g/dL (ref 30.0–36.0)
MCV: 93 fL (ref 78.0–100.0)
MONO ABS: 0.7 10*3/uL (ref 0.1–1.0)
MONOS PCT: 12 %
NEUTROS ABS: 3.8 10*3/uL (ref 1.7–7.7)
Neutrophils Relative %: 60 %
PLATELETS: 183 10*3/uL (ref 150–400)
RBC: 4.89 MIL/uL (ref 4.22–5.81)
RDW: 14.2 % (ref 11.5–15.5)
WBC: 6.3 10*3/uL (ref 4.0–10.5)

## 2018-02-07 LAB — URINALYSIS, ROUTINE W REFLEX MICROSCOPIC
BILIRUBIN URINE: NEGATIVE
Glucose, UA: NEGATIVE mg/dL
HGB URINE DIPSTICK: NEGATIVE
Ketones, ur: NEGATIVE mg/dL
Leukocytes, UA: NEGATIVE
Nitrite: NEGATIVE
PH: 6 (ref 5.0–8.0)
Protein, ur: NEGATIVE mg/dL
SPECIFIC GRAVITY, URINE: 1.02 (ref 1.005–1.030)

## 2018-02-07 LAB — LIPASE, BLOOD: Lipase: 23 U/L (ref 11–51)

## 2018-02-07 MED ORDER — HYDROCODONE-ACETAMINOPHEN 5-325 MG PO TABS
1.0000 | ORAL_TABLET | Freq: Once | ORAL | Status: AC
  Administered 2018-02-07: 1 via ORAL
  Filled 2018-02-07: qty 1

## 2018-02-07 MED ORDER — METHOCARBAMOL 500 MG PO TABS: 1000.0000 mg | ORAL_TABLET | Freq: Four times a day (QID) | ORAL | 0 refills | Status: DC | PRN

## 2018-02-07 MED ORDER — HYDROCODONE-ACETAMINOPHEN 5-325 MG PO TABS: ORAL_TABLET | ORAL | 0 refills | Status: DC

## 2018-02-07 NOTE — ED Notes (Signed)
Pt ambulatory to waiting room. Pt verbalized understanding of discharge instructions.   

## 2018-02-07 NOTE — ED Triage Notes (Signed)
Pt C/O left sided abdominal pain that started around 1 week ago. Pt states it started after moving a couch. Pt reports it hurting worse when bending over or picking up his grandchild.

## 2018-02-07 NOTE — ED Provider Notes (Signed)
[facility] Provider [facility]   CSN: [id number] Arrival date & time: 02/07/18  0319     History   Chief Complaint Chief Complaint  Patient presents with  . Abdominal Pain    HPI Glenn Levine is a 62 y.o. male.  HPI Pt was seen at 0345.  Per pt, c/o gradual onset and persistence of constant left abd "pain" for the past 1 week. Has been associated with no other symptoms.  Describes the abd pain as "sore."  Pt states the pain started after he moved a couch. Denies N/V, no diarrhea, no fevers, no back pain, no rash, no CP/SOB, no black or blood in stools, no dysuria/hematuria, no testicular pain/swelling.       History reviewed. No pertinent past medical history.  There are no active problems to display for this patient.   History reviewed. No pertinent surgical history.      Home Medications    Prior to Admission medications   Medication Sig Start Date End Date Taking? Authorizing Provider  dicyclomine (BENTYL) 20 MG tablet Take 1 tablet (20 mg total) by mouth every 6 (six) hours as needed for spasms (abdominal cramping). Patient not taking: Reported on 09/16/2017 07/28/17   Samuel JesterMcManus, Meghan Tiemann, DO  ondansetron (ZOFRAN ODT) 4 MG disintegrating tablet Take 1 tablet (4 mg total) by mouth every 8 (eight) hours as needed for nausea or vomiting. Patient not taking: Reported on 09/16/2017 07/28/17   Samuel JesterMcManus, Roxy Mastandrea, DO  Phenyleph-Doxylamine-DM-APAP (ALKA-SELTZER PLS ALLERGY [facility]) 5-6.25-10-325 MG CAPS Take 1-2 capsules by mouth daily as needed (for cold/fever).    [provider]    Family History No family history on file.  Social History Social History   Tobacco Use  . Smoking status: Current Every Day Smoker    Packs/day: 1.00    Types: Cigarettes  . Smokeless tobacco: Never Used  Substance Use Topics  . Alcohol use: Yes    Comment: socially  . Drug use: No     Allergies   Erythromycin   Review of Systems Review of  Systems ROS: Statement: All systems negative except as marked or noted in the HPI; Constitutional: Negative for fever and chills. ; ; Eyes: Negative for eye pain, redness and discharge. ; ; ENMT: Negative for ear pain, hoarseness, nasal congestion, sinus pressure and sore throat. ; ; Cardiovascular: Negative for chest pain, palpitations, diaphoresis, dyspnea and peripheral edema. ; ; Respiratory: Negative for cough, wheezing and stridor. ; ; Gastrointestinal: +abd pain. Negative for nausea, vomiting, diarrhea, blood in stool, hematemesis, jaundice and rectal bleeding. . ; ; Genitourinary: Negative for dysuria, flank pain and hematuria. ; ; Genital:  No penile drainage or rash, no testicular pain or swelling, no scrotal rash or swelling. ;; Musculoskeletal: Negative for back pain and neck pain. Negative for swelling and trauma.; ; Skin: Negative for pruritus, rash, abrasions, blisters, bruising and skin lesion.; ; Neuro: Negative for headache, lightheadedness and neck stiffness. Negative for weakness, altered level of consciousness, altered mental status, extremity weakness, paresthesias, involuntary movement, seizure and syncope.       Physical Exam Updated Vital Signs BP (!) 156/92 (BP Location: Left Arm)   Pulse 61   Temp 98.2 F (36.8 C) (Oral)   Resp 17   Wt 74.8 kg (165 lb)   SpO2 99%   BMI 24.37 kg/m   Physical Exam 0350: Physical examination:  Nursing notes reviewed; Vital signs and O2 SAT reviewed;  Constitutional: Well developed, Well nourished, Well hydrated, In  no acute distress; Head:  Normocephalic, atraumatic; Eyes: EOMI, PERRL, No scleral icterus; ENMT: Mouth and pharynx normal, Mucous membranes moist; Neck: Supple, Full range of motion, No lymphadenopathy; Cardiovascular: Regular rate and rhythm, No gallop; Respiratory: Breath sounds clear & equal bilaterally, No wheezes.  Speaking full sentences with ease, Normal respiratory effort/excursion; Chest: Nontender, Movement normal;  Abdomen: Soft, +left sided mid and low abd tenderness to palp. No rebound or guarding. No palp hernias. Nondistended, Normal bowel sounds; Genitourinary: No CVA tenderness; Extremities: Peripheral pulses normal, No tenderness, No edema, No calf edema or asymmetry.; Neuro: AA&Ox3, Major CN grossly intact.  Speech clear. No gross focal motor or sensory deficits in extremities. Climbs on and off stretcher easily by himself. Gait steady..; Skin: Color normal, Warm, Dry.   ED Treatments / Results  Labs (all labs ordered are listed, but only abnormal results are displayed)   EKG None  Radiology   Procedures Procedures (including critical care time)  Medications Ordered in ED Medications - No data to display   Initial Impression / Assessment and Plan / ED Course  I have reviewed the triage vital signs and the nursing notes.  Pertinent labs & imaging results that were available during my care of the patient were reviewed by me and considered in my medical decision making (see chart for details).  MDM Reviewed: previous chart, nursing note and vitals Reviewed previous: labs Interpretation: labs and CT scan   Results for orders placed or performed during the hospital encounter of 02/07/18  Comprehensive metabolic panel  Result Value Ref Range   Sodium 137 135 - 145 mmol/L   Potassium 3.8 3.5 - 5.1 mmol/L   Chloride 104 98 - 111 mmol/L   CO2 26 22 - 32 mmol/L   Glucose, Bld 121 (H) 70 - 99 mg/dL   BUN 13 8 - 23 mg/dL   Creatinine, Ser 4.09 0.61 - 1.24 mg/dL   Calcium 9.1 8.9 - 81.1 mg/dL   Total Protein 7.2 6.5 - 8.1 g/dL   Albumin 3.8 3.5 - 5.0 g/dL   AST 22 15 - 41 U/L   ALT 16 0 - 44 U/L   Alkaline Phosphatase 42 38 - 126 U/L   Total Bilirubin 0.9 0.3 - 1.2 mg/dL   GFR calc non Af Amer >60 >60 mL/min   GFR calc Af Amer >60 >60 mL/min   Anion gap 7 5 - 15  CBC with Differential  Result Value Ref Range   WBC 6.3 4.0 - 10.5 K/uL   RBC 4.89 4.22 - 5.81 MIL/uL   Hemoglobin  15.2 13.0 - 17.0 g/dL   HCT 91.4 78.2 - 95.6 %   MCV 93.0 78.0 - 100.0 fL   MCH 31.1 26.0 - 34.0 pg   MCHC 33.4 30.0 - 36.0 g/dL   RDW 21.3 08.6 - 57.8 %   Platelets 183 150 - 400 K/uL   Neutrophils Relative % 60 %   Neutro Abs 3.8 1.7 - 7.7 K/uL   Lymphocytes Relative 25 %   Lymphs Abs 1.6 0.7 - 4.0 K/uL   Monocytes Relative 12 %   Monocytes Absolute 0.7 0.1 - 1.0 K/uL   Eosinophils Relative 2 %   Eosinophils Absolute 0.1 0.0 - 0.7 K/uL   Basophils Relative 1 %   Basophils Absolute 0.0 0.0 - 0.1 K/uL  Lipase, blood  Result Value Ref Range   Lipase 23 11 - 51 U/L  Urinalysis, Routine w reflex microscopic  Result Value Ref Range  Color, Urine YELLOW YELLOW   APPearance CLEAR CLEAR   Specific Gravity, Urine 1.020 1.005 - 1.030   pH 6.0 5.0 - 8.0   Glucose, UA NEGATIVE NEGATIVE mg/dL   Hgb urine dipstick NEGATIVE NEGATIVE   Bilirubin Urine NEGATIVE NEGATIVE   Ketones, ur NEGATIVE NEGATIVE mg/dL   Protein, ur NEGATIVE NEGATIVE mg/dL   Nitrite NEGATIVE NEGATIVE   Leukocytes, UA NEGATIVE NEGATIVE   Ct Renal Stone Study Result Date: 02/07/2018 CLINICAL DATA:  Initial evaluation for acute left-sided abdominal pain. EXAM: CT ABDOMEN AND PELVIS WITHOUT CONTRAST TECHNIQUE: Multidetector CT imaging of the abdomen and pelvis was performed following the standard protocol without IV contrast. COMPARISON:  Prior CT from 07/28/2017. FINDINGS: Lower chest: Visualized lung bases are clear. Hepatobiliary: Liver demonstrates a normal unenhanced appearance. Gallbladder within normal limits. No biliary dilatation. Pancreas: Pancreas within normal limits. Spleen: Unremarkable. Adrenals/Urinary Tract: Adrenal glands are within normal limits. Kidneys equal in size without evidence for nephrolithiasis or hydronephrosis. No radiopaque calculi seen along the course of either renal collecting system. No hydroureter. Bladder largely decompressed without acute abnormality. No layering stones within the bladder  lumen. Stomach/Bowel: Stomach within normal limits. Prominent duodenal diverticulum noted. No evidence for bowel obstruction. Low-density material again noted within the distal appendiceal lumen without appendiceal dilatation. No evidence for acute appendicitis. Diverticulosis without evidence for acute diverticulitis. No acute inflammatory changes seen about the bowels. 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 Mild stranding about the root of the mesentery with shotty subcentimeter lymph nodes, similar to previous. No pathologically enlarged lymph nodes within the abdomen and pelvis. Reproductive: Prostate within normal limits. Other: No free air or fluid. Musculoskeletal: No acute osseus abnormality. No worrisome lytic or blastic osseous lesions. 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 IMPRESSION: 1. No CT evidence for acute intra-abdominal or pelvic process. 2. No nephrolithiasis or obstructive uropathy. 3. 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 Recommendations as previously described. Electronically Signed   By: Rise Mu M.D.   On: 02/07/2018 05:04    0555:  Pt has tol PO well while in the ED without N/V.  No stooling while in the ED.  Abd benign, VSS. Feels better and wants to go home now. Workup reassuring. Tx symptomatically at this time. Dx and testing d/w pt.  Questions answered.  Verb understanding, agreeable to d/c home with outpt f/u.    Final Clinical Impressions(s) / ED Diagnoses   Final diagnoses:  None    ED Discharge Orders    None       Samuel Jester, DO 02/10/18 1512

## 2018-02-07 NOTE — Discharge Instructions (Signed)
Take the prescriptions as directed.  Apply moist heat or ice to the area(s) of discomfort, for 15 minutes at a time, several times per day for the next few days.  Do not fall asleep on a heating or ice pack.  Call your regular medical doctor on Monday to schedule a follow up appointment this week.  Return to the Emergency Department immediately if worsening. ° °

## 2019-09-19 ENCOUNTER — Emergency Department (HOSPITAL_COMMUNITY)
Admission: EM | Admit: 2019-09-19 | Discharge: 2019-09-19 | Disposition: A | Discharge status: Home / Self Care | Payer: Self-pay | Attending: Emergency Medicine | Admitting: Emergency Medicine

## 2019-09-19 ENCOUNTER — Emergency Department (HOSPITAL_COMMUNITY): Payer: Self-pay

## 2019-09-19 ENCOUNTER — Other Ambulatory Visit: Payer: Self-pay

## 2019-09-19 ENCOUNTER — Encounter (HOSPITAL_COMMUNITY): Payer: Self-pay | Admitting: Emergency Medicine

## 2019-09-19 DIAGNOSIS — J189 Pneumonia, unspecified organism: Secondary | ICD-10-CM | POA: Insufficient documentation

## 2019-09-19 DIAGNOSIS — F17210 Nicotine dependence, cigarettes, uncomplicated: Secondary | ICD-10-CM | POA: Insufficient documentation

## 2019-09-19 MED ORDER — AMOXICILLIN 500 MG PO TABS: 1000.0000 mg | ORAL_TABLET | Freq: Two times a day (BID) | ORAL | 0 refills | Status: AC

## 2019-09-19 MED ORDER — DOXYCYCLINE HYCLATE 100 MG PO CAPS: 100.0000 mg | ORAL_CAPSULE | Freq: Two times a day (BID) | ORAL | 0 refills | Status: AC

## 2019-09-19 NOTE — Discharge Instructions (Signed)
Please quit smoking. As we discussed, after you finish your antibiotics you should have an x-ray of your chest in the next 3 to 4 weeks.  This needs to be done to ensure that there is no signs of cancer in your lungs. I have given several numbers for you to call to follow-up with a new primary care doctor. You may also be receiving a phone call to get a PCP established Please return if you have any worsening chest pain, new shortness of breath, or cough up any blood in the next 3 to 4 days

## 2019-09-19 NOTE — ED Provider Notes (Signed)
[facility] EMERGENCY DEPARTMENT Provider Note   CSN: [id number] Arrival date & time: 09/19/19  0430     History Chief Complaint  Patient presents with  . Cough    Glenn Levine is a 64 y.o. male.  The history is provided by the patient.  Cough Cough characteristics:  Non-productive Severity:  Mild Onset quality:  Gradual Timing:  Intermittent Progression:  Unchanged Chronicity:  Recurrent Smoker: yes   Relieved by:  None tried Worsened by:  Nothing Associated symptoms: no chills, no fever, no shortness of breath and no weight loss   Risk factors: no recent infection and no recent travel   Patient presents for intermittent right upper chest pain & minimal cough.  He reports it has been ongoing for a while.  He reports the pain is usually in his right upper chest as well as in his right shoulder.  He thought it might have started when he moved a couch a few months ago.  No fever/vomiting.  No shortness of breath or fatigue.  No night sweats or weight loss No Recent travel.  He lives here in [place] with his wife. No pleuritic pain. No hemoptysis.  He denies any other known medical conditions.  He has otherwise felt well     PMH-none Soc hx - daily smoker Social History   Tobacco Use  . Smoking status: Current Every Day Smoker    Packs/day: 1.00    Types: Cigarettes  . Smokeless tobacco: Never Used  Substance Use Topics  . Alcohol use: Yes    Comment: socially  . Drug use: No    Home Medications Prior to Admission medications   Medication Sig Start Date End Date Taking? Authorizing Provider  amoxicillin (AMOXIL) 500 MG tablet Take 2 tablets (1,000 mg total) by mouth 2 (two) times daily. 09/19/19   Zadie Rhine, MD  doxycycline (VIBRAMYCIN) 100 MG capsule Take 1 capsule (100 mg total) by mouth 2 (two) times daily. One po bid x 7 days 09/19/19   Zadie Rhine, MD  dicyclomine (BENTYL) 20 MG tablet Take 1 tablet (20 mg total) by mouth every 6 (six) hours  as needed for spasms (abdominal cramping). Patient not taking: Reported on 09/16/2017 07/28/17 09/19/19  Samuel Jester, DO    Allergies    Erythromycin  Review of Systems   Review of Systems  Constitutional: Negative for chills, fatigue, fever, unexpected weight change and weight loss.  Respiratory: Positive for cough. Negative for shortness of breath.   Gastrointestinal: Negative for vomiting.  Neurological: Negative for weakness.  All other systems reviewed and are negative.   Physical Exam Updated Vital Signs BP 139/87 (BP Location: Right Arm)   Pulse 84   Temp 98.7 F (37.1 C) (Oral)   Resp 17   Ht 1.753 m (5\' 9" )   Wt 74.8 kg   SpO2 99%   BMI 24.37 kg/m   Physical Exam CONSTITUTIONAL: Well developed/well nourished HEAD: Normocephalic/atraumatic EYES: EOMI ENMT: Mucous membranes moist NECK: supple no meningeal signs SPINE/BACK:entire spine nontender CV: S1/S2 noted, no murmurs/rubs/gallops noted LUNGS: Lungs are clear to auscultation bilaterally, no apparent distress ABDOMEN: soft, nontender NEURO: Pt is awake/alert/appropriate, moves all extremitiesx4.  No facial droop.  Patient ambulates without difficulty EXTREMITIES: pulses normal/equal, full ROM, no lower extremity edema SKIN: warm, color normal PSYCH: no abnormalities of mood noted, alert and oriented to situation  ED Results / Procedures / Treatments   Labs (all labs ordered are listed, but only abnormal results are displayed) Labs Reviewed -  No data to display  EKG EKG Interpretation  Date/Time:  Monday September 19 2019 05:21:29 EST Ventricular Rate:  66 PR Interval:    QRS Duration: 104 QT Interval:  370 QTC Calculation: 388 R Axis:   -18 Text Interpretation: Sinus rhythm Borderline left axis deviation Anteroseptal infarct, old Borderline T abnormalities, inferior leads Baseline wander in lead(s) II aVF Confirmed by Ripley Fraise [phone]) on 09/19/2019 5:31:33 AM   Radiology DG Chest Port  1 View  Result Date: 09/19/2019 CLINICAL DATA:  Cough. EXAM: PORTABLE CHEST 1 VIEW COMPARISON:  09/19/2017 FINDINGS: Patchy, consolidative and nodular airspace opacities in the right upper lobe, new from prior exam. Left lung is clear. Heart is normal in size. Mild aortic tortuosity. Possible pleural thickening in the right lung apex. No significant subpulmonic fluid. No pulmonary edema. No pneumothorax. IMPRESSION: Patchy, nodular and consolidative airspace opacities in the right upper lobe. Findings may represent pneumonia in the setting of cough. Followup PA and lateral chest X-ray is strongly recommended in 3-4 weeks following trial of antibiotic therapy to ensure resolution and exclude underlying malignancy. Electronically Signed   By: Keith Rake M.D.   On: 09/19/2019 05:32    Procedures Procedures  Medications Ordered in ED Medications - No data to display  ED Course  I have reviewed the triage vital signs and the nursing notes.  Pertinent imaging results that were available during my care of the patient were reviewed by me and considered in my medical decision making (see chart for details).    MDM Rules/Calculators/A&P                      Patient with abnormal x-ray findings.  This could represent pneumonia, therefore he would need to be treated for pneumonia for a week.  He was given dual therapy with amoxicillin and doxycycline, chosen mostly because of allergy profile as well as cost.  Patient does not have a primary care physician.  He is a daily smoker.  Unfortunately this may represent cancer.  Patient was informed of this and we talked about this extensively.  He understands the need to have a repeat chest x-ray in 3 to 4 weeks to ensure this has resolved after antibiotics.  If it is not resolved, this may represent cancer.  Patient was also counseled on the need to stop smoking. He was given referral information for primary care physician  He has no pleuritic pain or  hemoptysis to suggest acute PE.  He has normal oxygenation.  Low suspicion for ACS.  He has had no symptoms of COVID-19.  He appears low risk for tuberculosis  Final Clinical Impression(s) / ED Diagnoses Final diagnoses:  Community acquired pneumonia of right upper lobe of lung    Rx / DC Orders ED Discharge Orders         Ordered    amoxicillin (AMOXIL) 500 MG tablet  2 times daily     09/19/19 0618    doxycycline (VIBRAMYCIN) 100 MG capsule  2 times daily     09/19/19 8099           Ripley Fraise, MD 09/19/19 [phone]

## 2019-09-19 NOTE — ED Triage Notes (Signed)
Pt c/o pain when he coughs. Pain is located to upper back.

## 2019-09-27 IMAGING — DX DG CHEST 2V
2 series · 2 of 2 positions shown · non-contrast
Comparison: 06/06/2016

CLINICAL DATA: Abdominal pain, nausea, vomiting

EXAM:
CHEST  2 VIEW

[chest pa]
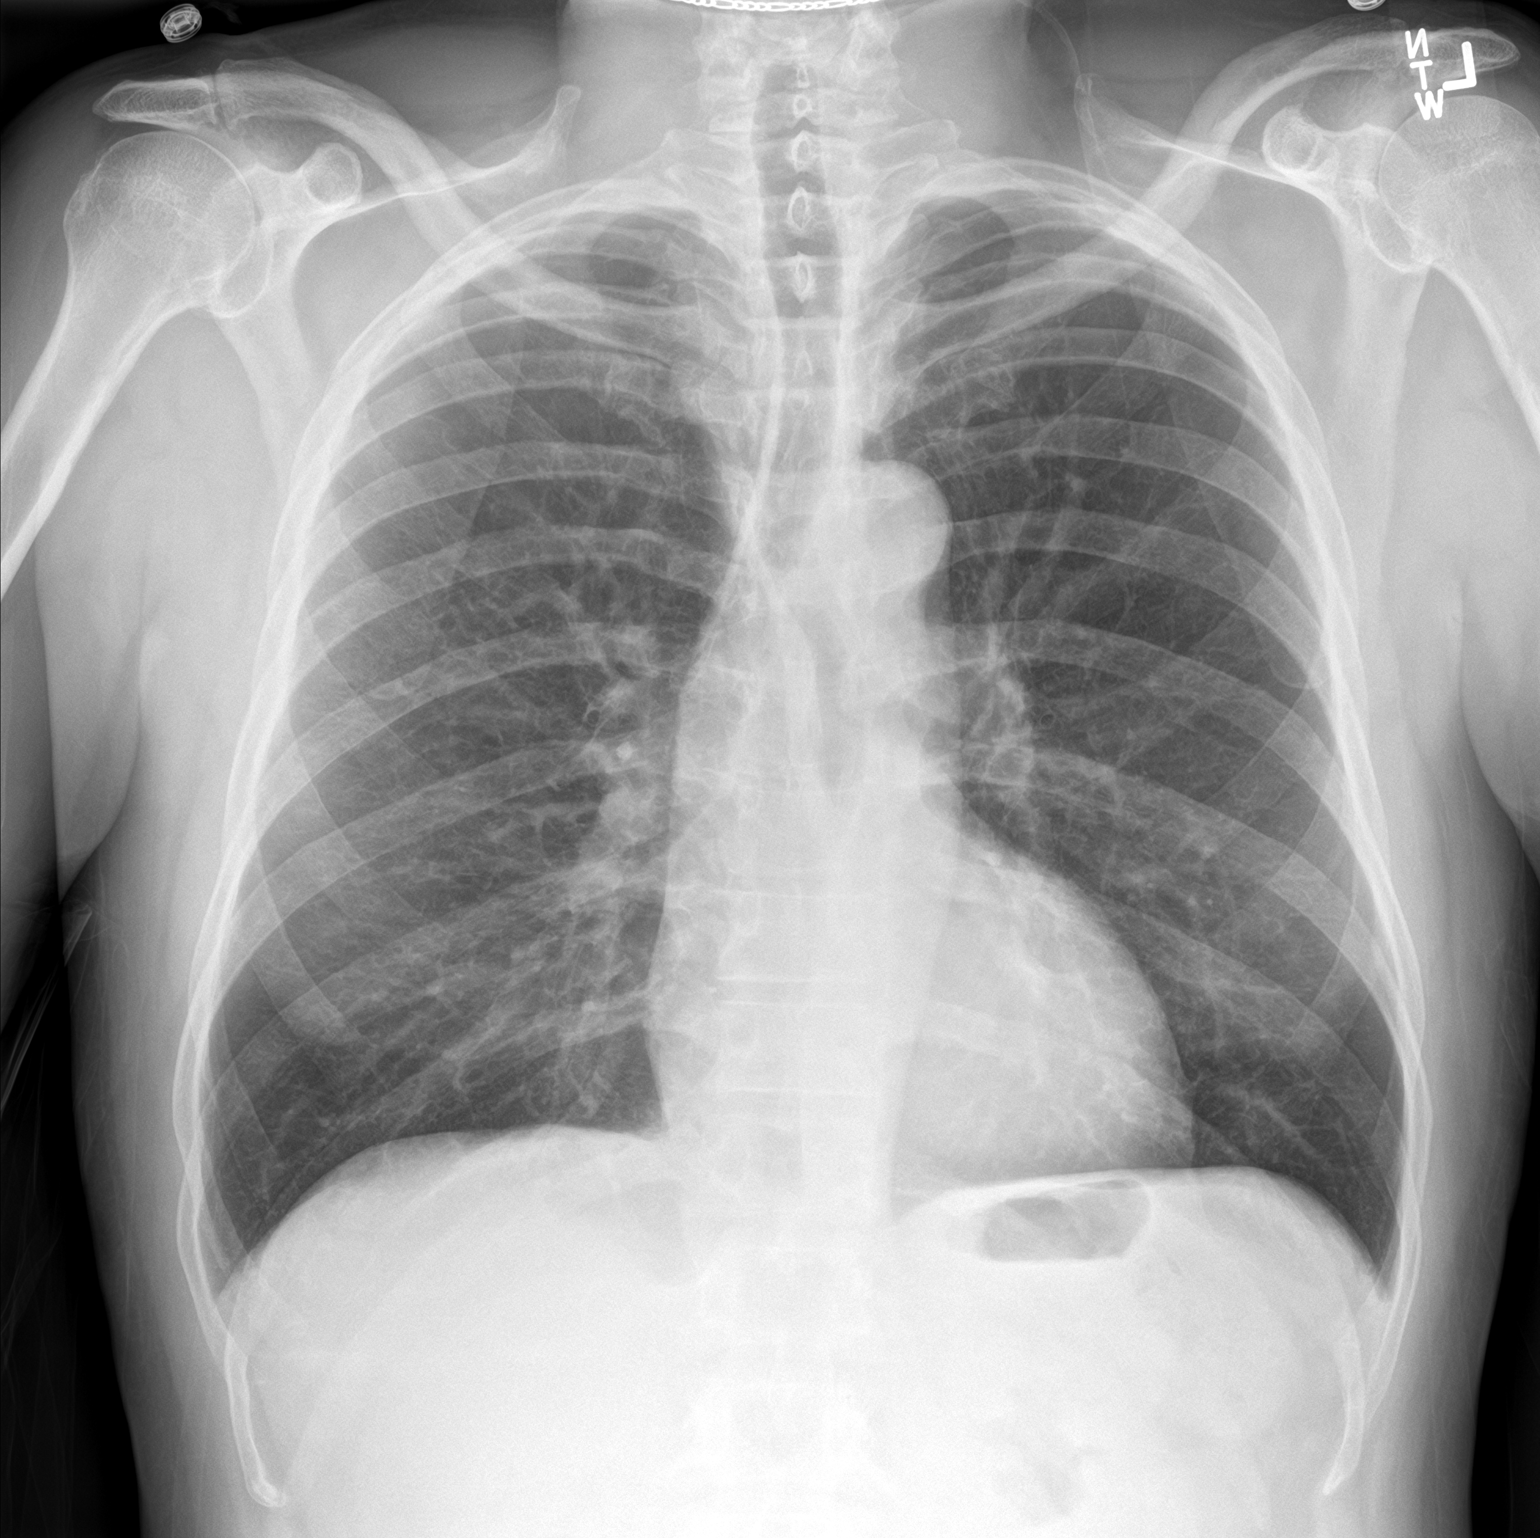

[chest lat]
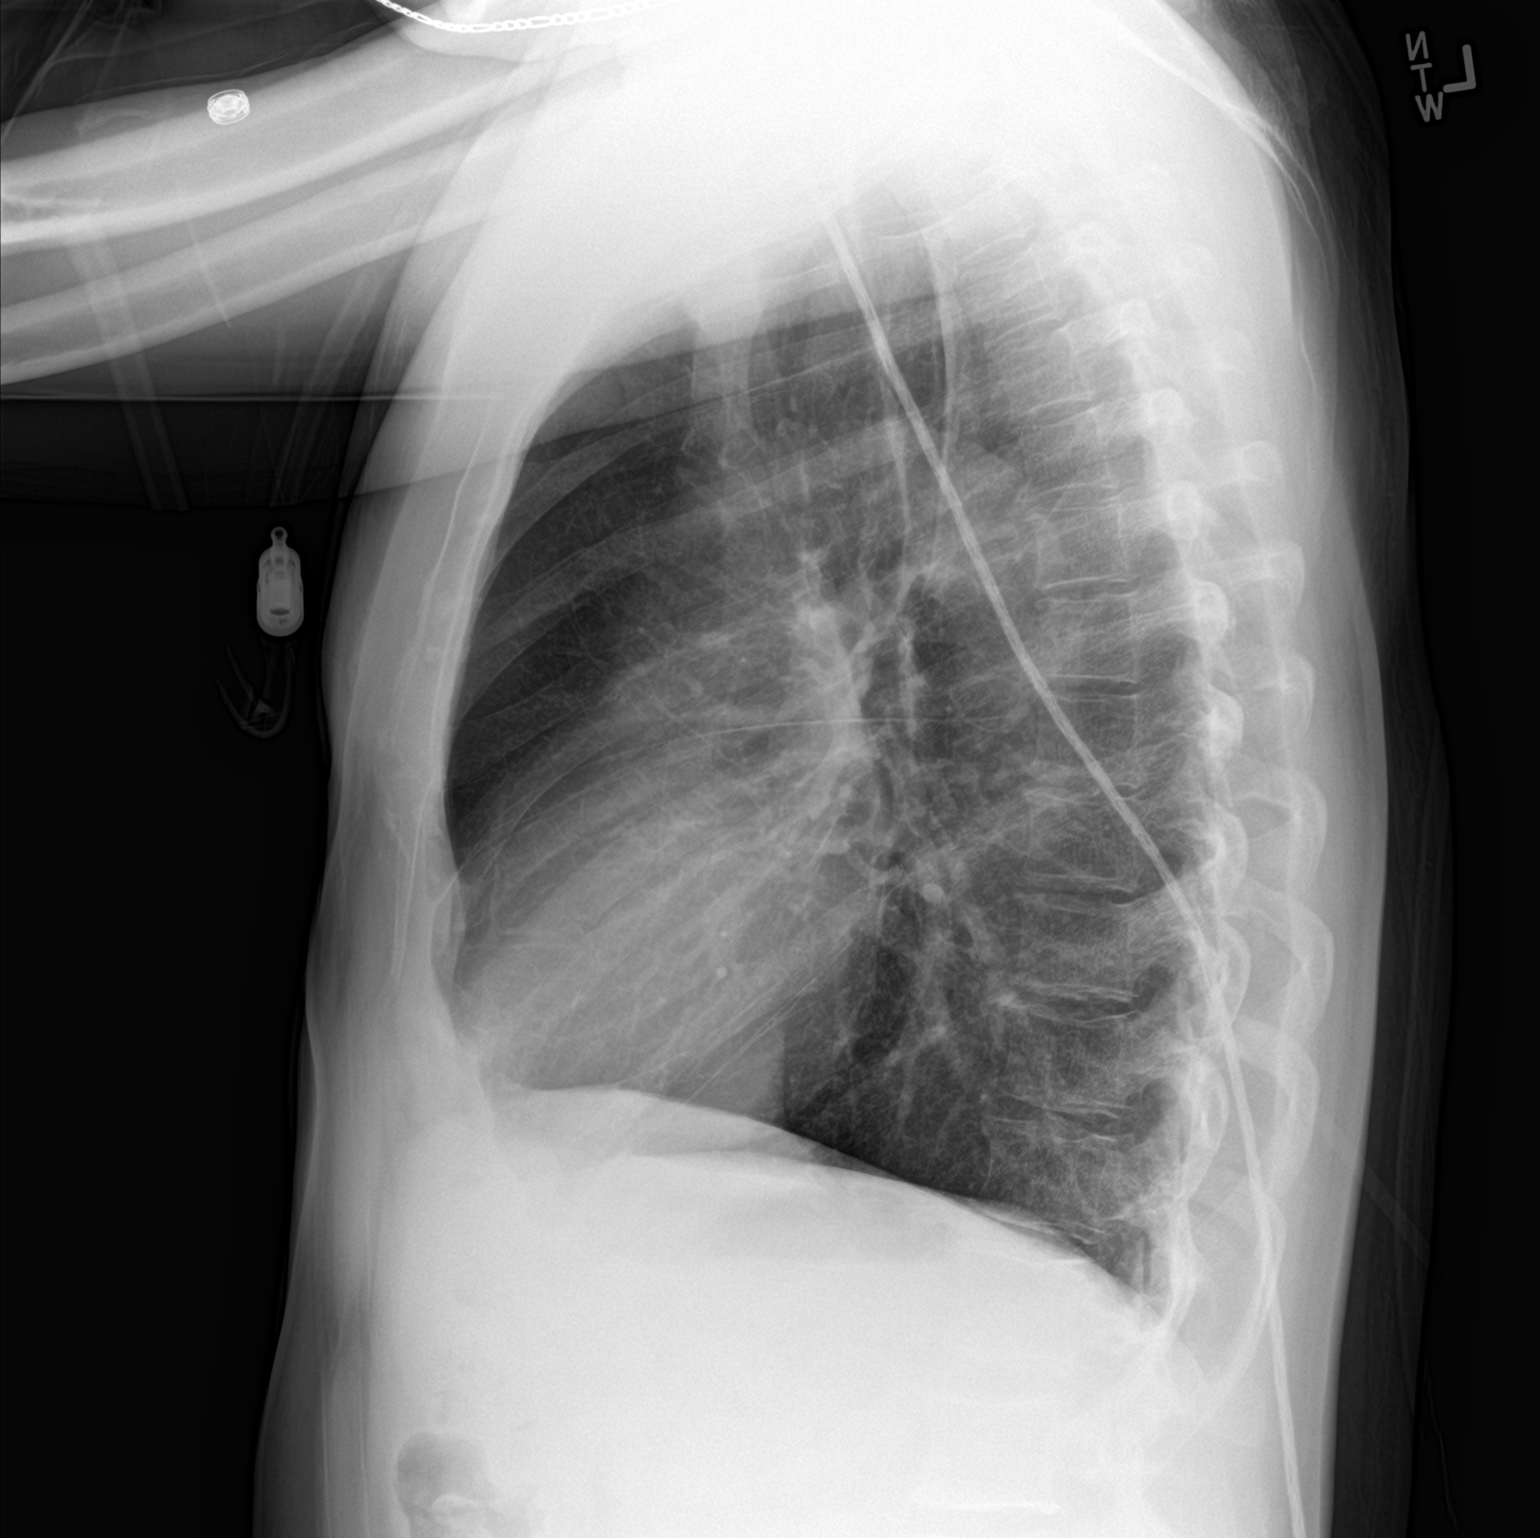

[2 of 2 positions shown; findings below may reference images not displayed]

FINDINGS: Heart and mediastinal contours are within normal limits. No focal
opacities or effusions. No acute bony abnormality. Mild
hyperinflation.
IMPRESSION: Stable mild hyperinflation.  No active cardiopulmonary disease.

## 2019-11-19 IMAGING — DX DG CHEST 2V
2 series · 2 of 2 positions shown · non-contrast
Comparison: None.

CLINICAL DATA: Shortness of breath.  09/16/2017

EXAM:
CHEST  2 VIEW

[chest pa]
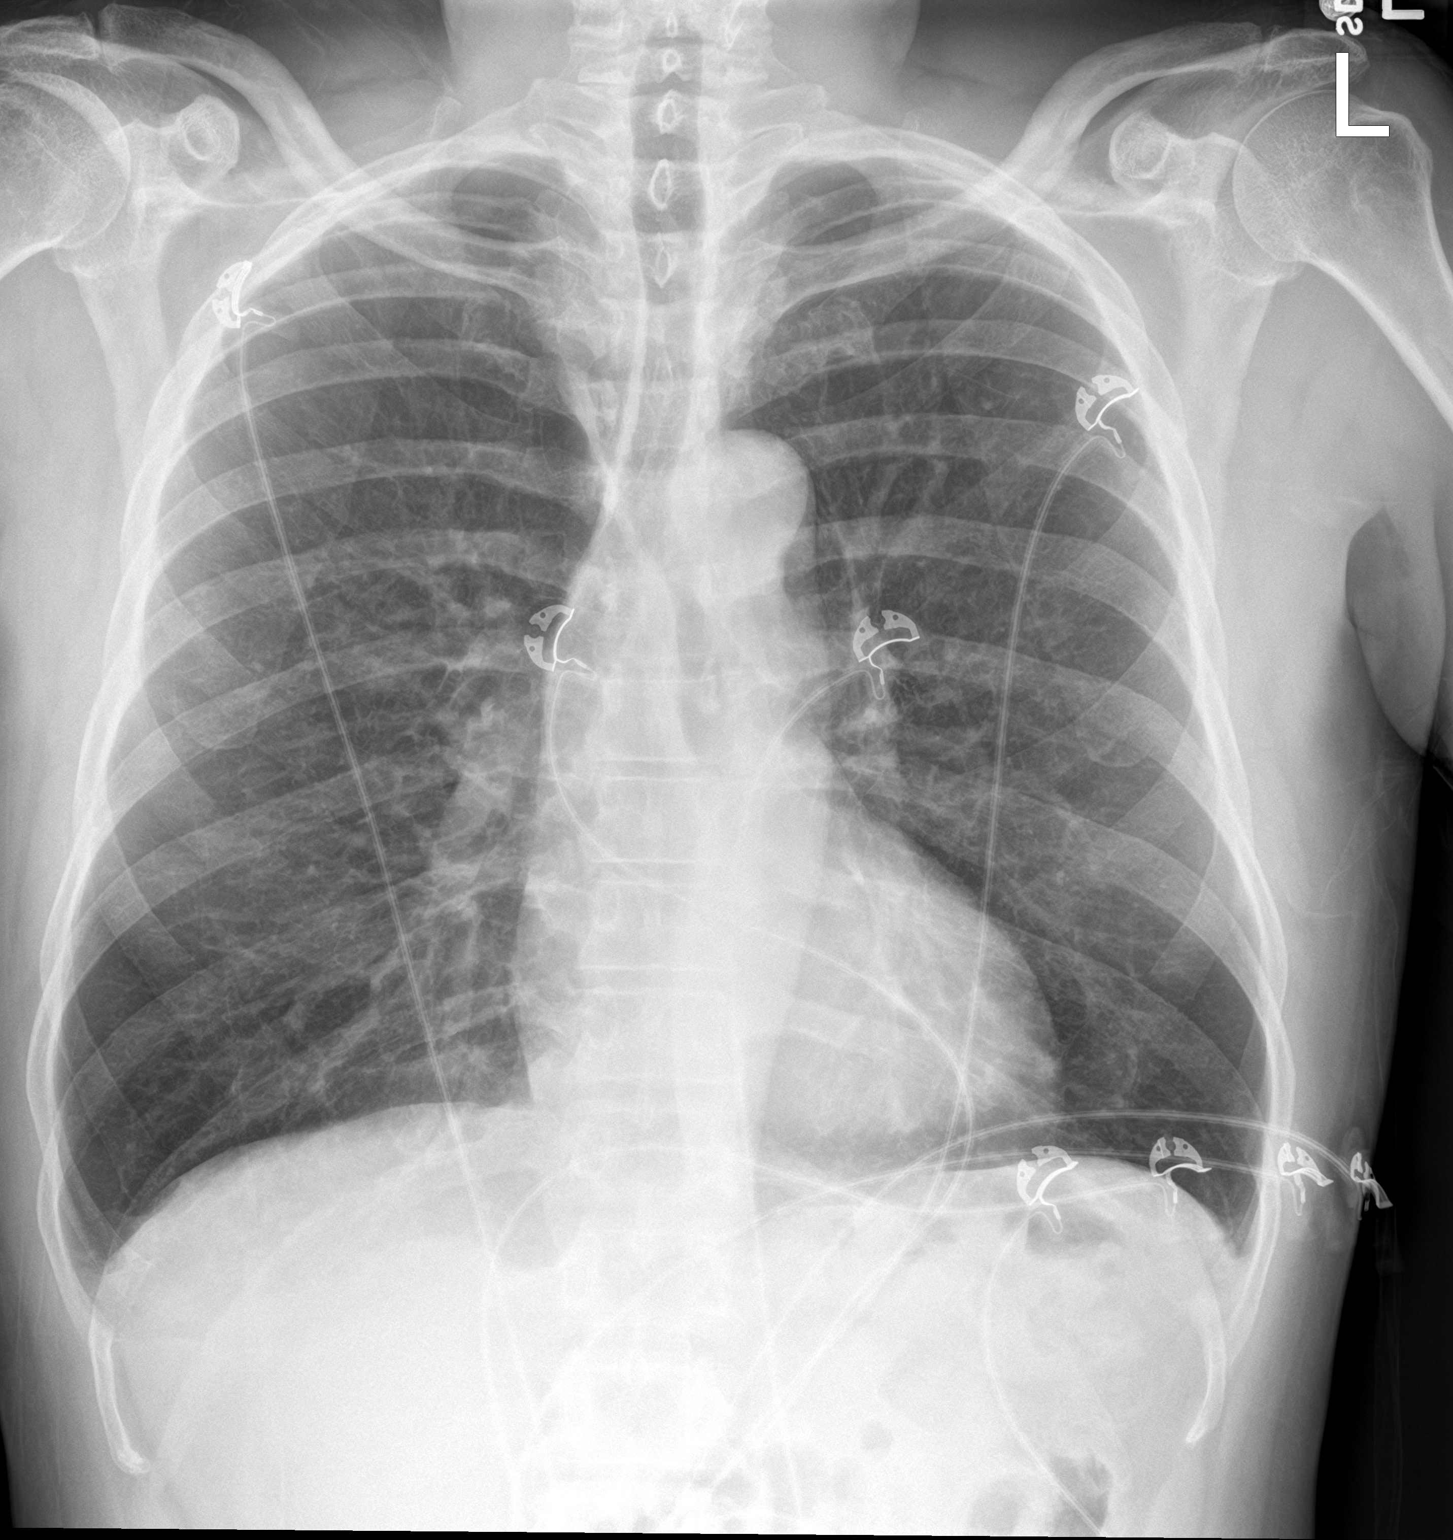

[chest lat]
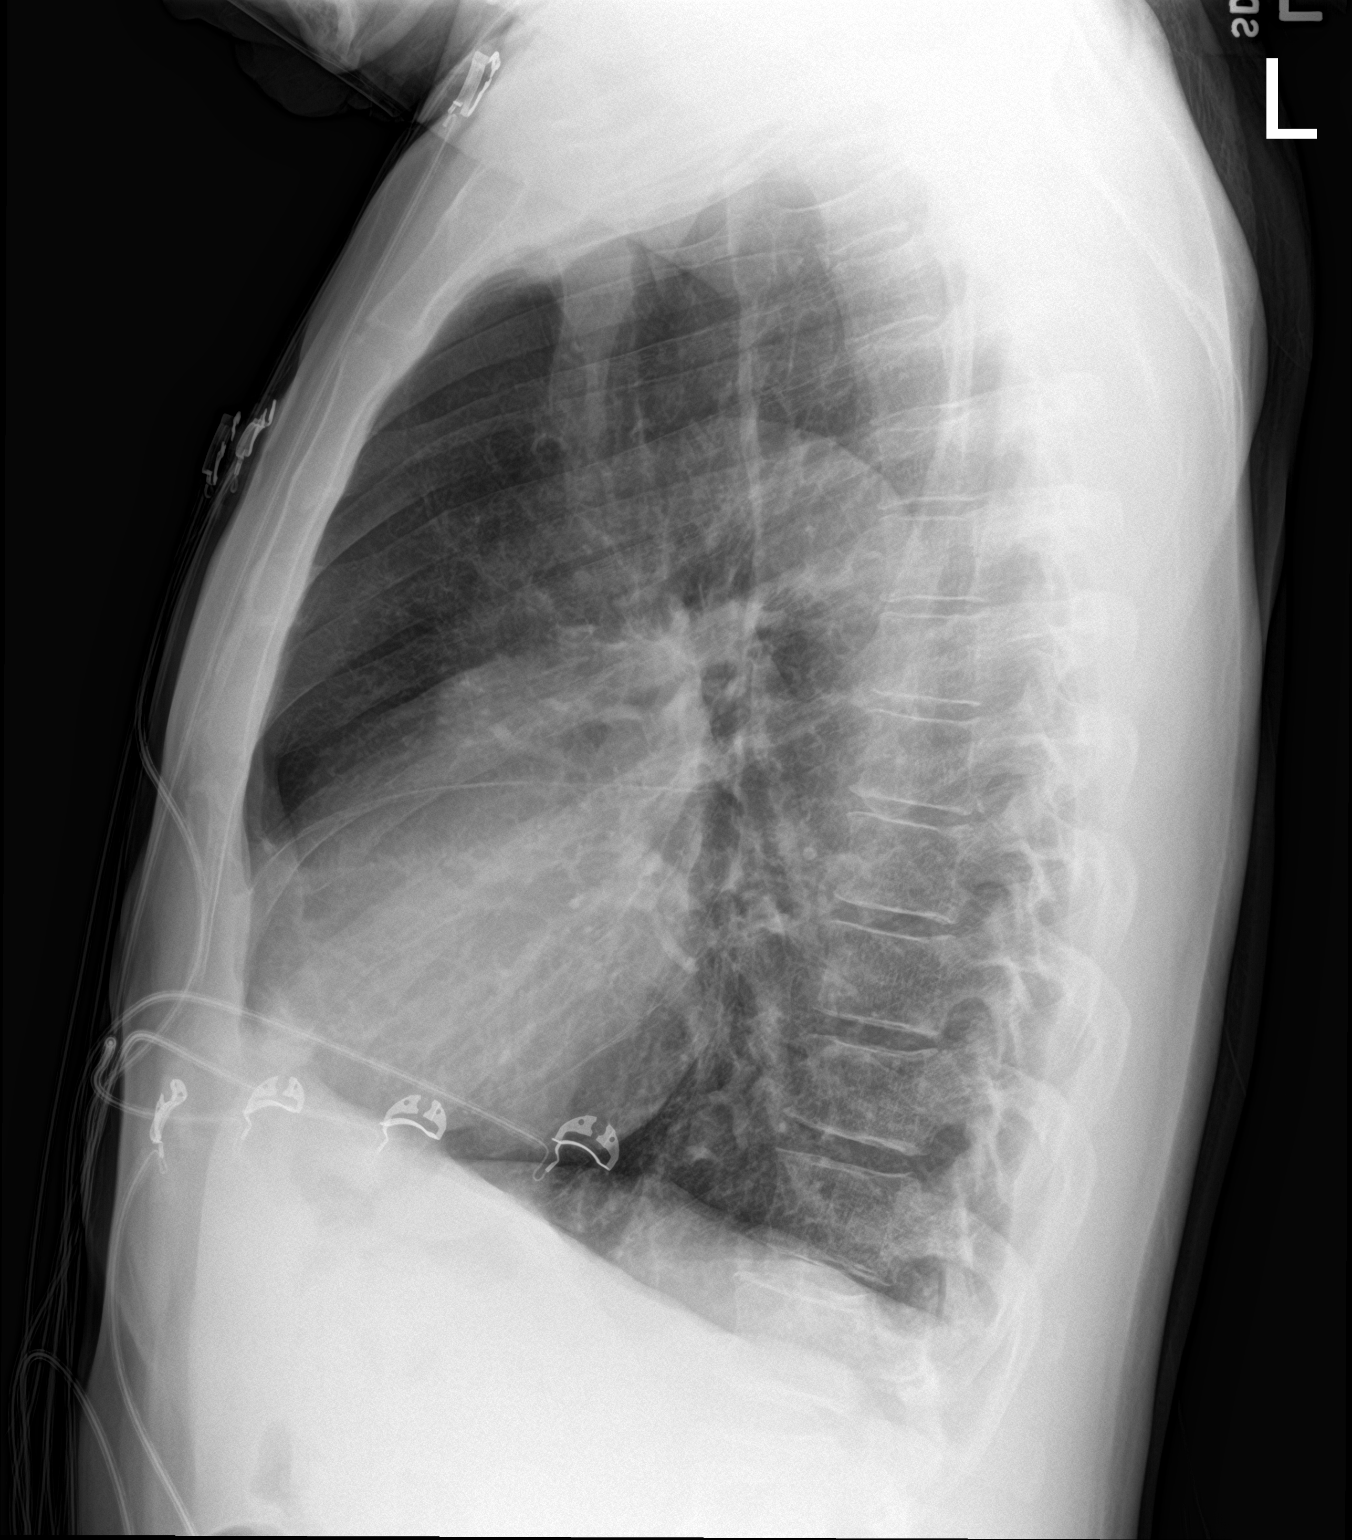

[2 of 2 positions shown; findings below may reference images not displayed]

FINDINGS: The heart size and mediastinal contours are within normal limits.
Both lungs are clear. The visualized skeletal structures are
unremarkable.
IMPRESSION: No active cardiopulmonary disease.

## 2021-11-18 IMAGING — DX DG CHEST 1V PORT
1 series · 1 of 1 positions shown · non-contrast
Comparison: 09/19/2017

CLINICAL DATA: Cough.

EXAM:
PORTABLE CHEST 1 VIEW

[chest ap]
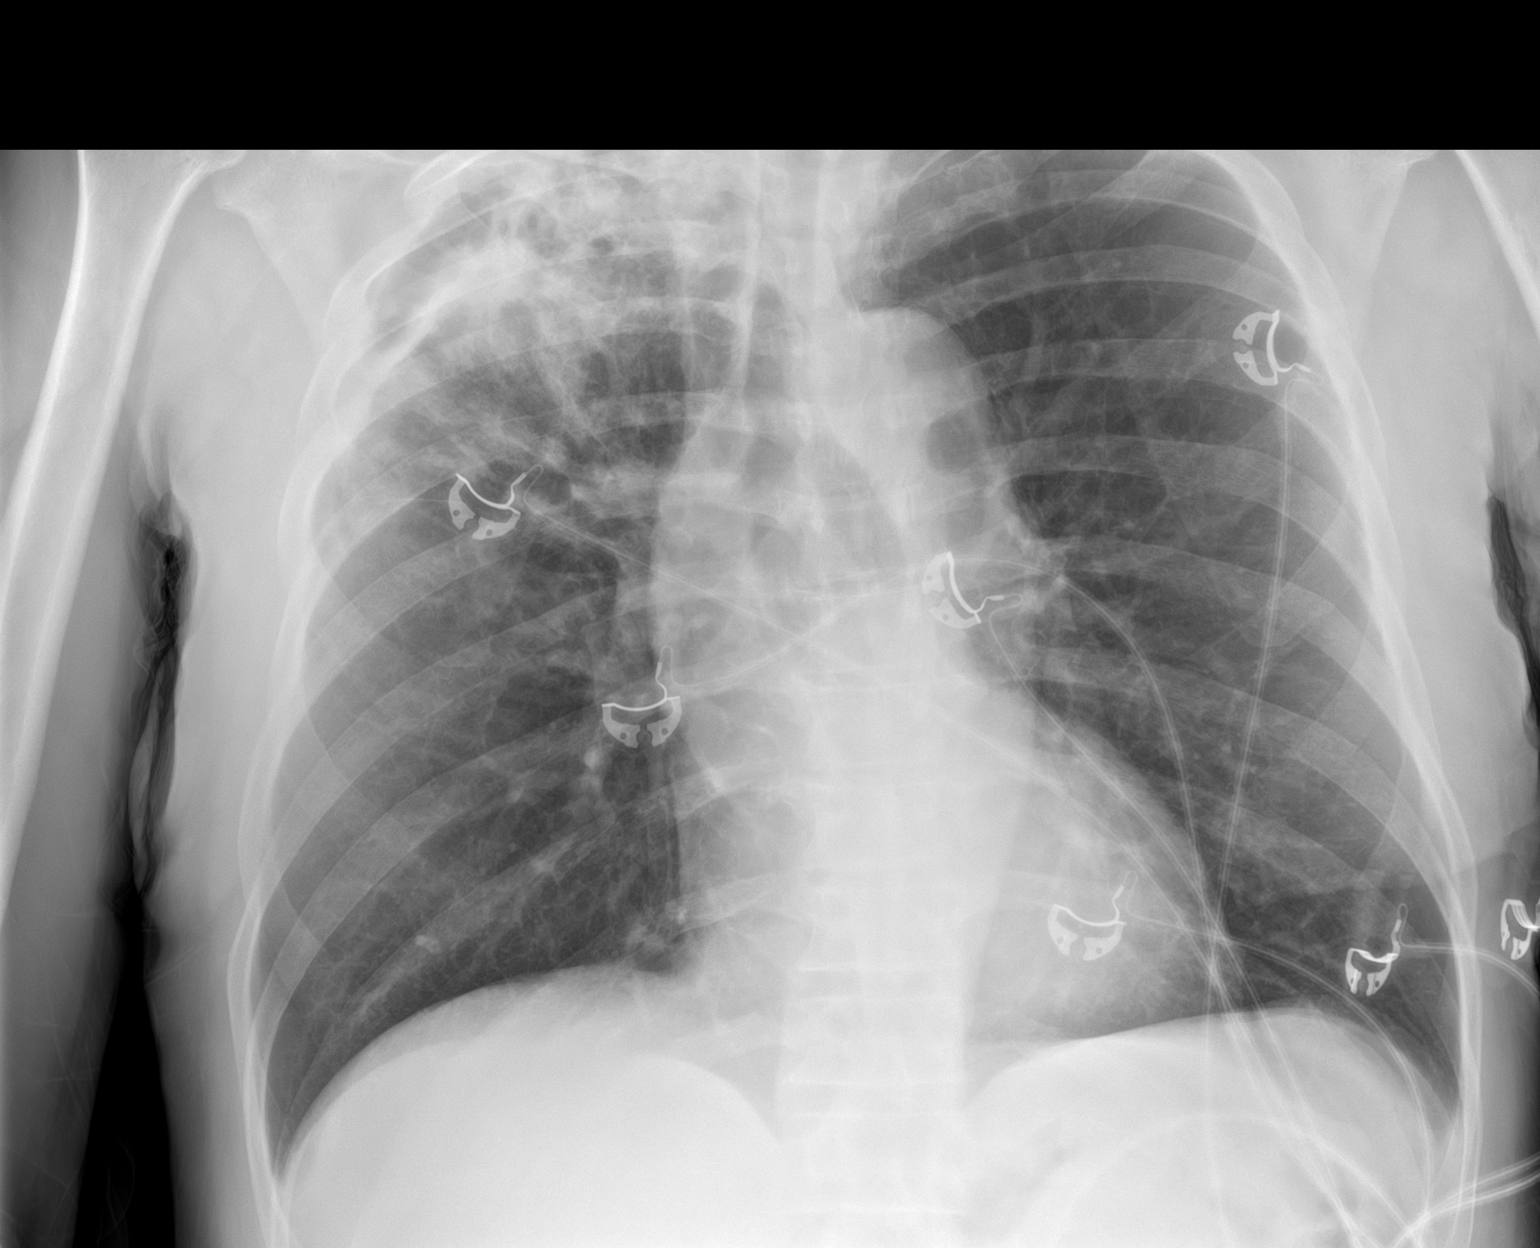

[1 of 1 positions shown; findings below may reference images not displayed]

FINDINGS: Patchy, consolidative and nodular airspace opacities in the right
upper lobe, new from prior exam. Left lung is clear. Heart is normal
in size. Mild aortic tortuosity. Possible pleural thickening in the
right lung apex. No significant subpulmonic fluid. No pulmonary
edema. No pneumothorax.
IMPRESSION: Patchy, nodular and consolidative airspace opacities in the right
upper lobe. Findings may represent pneumonia in the setting of
cough. Followup PA and lateral chest X-ray is strongly recommended
in 3-4 weeks following trial of antibiotic therapy to ensure
resolution and exclude underlying malignancy.
